# Patient Record
Sex: Female | Born: 1990 | Race: Black or African American | Hispanic: No | Marital: Married | Smoking: Never smoker
Health system: Southern US, Community
[De-identification: ages and names within clinical notes are randomized; demographics above are authoritative.]

## PROBLEM LIST (undated history)

## (undated) DIAGNOSIS — J353 Hypertrophy of tonsils with hypertrophy of adenoids: Secondary | ICD-10-CM

## (undated) DIAGNOSIS — Z98811 Dental restoration status: Secondary | ICD-10-CM

## (undated) HISTORY — PX: WISDOM TOOTH EXTRACTION: SHX21

## (undated) HISTORY — PX: HAND TENDON SURGERY: SHX663

## (undated) HISTORY — PX: FRACTURE SURGERY: SHX138

---
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
## 2012-04-20 ENCOUNTER — Encounter: Payer: Self-pay | Admitting: Obstetrics and Gynecology

## 2012-04-20 ENCOUNTER — Ambulatory Visit (INDEPENDENT_AMBULATORY_CARE_PROVIDER_SITE_OTHER): Payer: BC Managed Care – HMO | Admitting: Obstetrics and Gynecology

## 2012-04-20 VITALS — BP 108/60 | HR 92 | Temp 97.9°F | Resp 20 | Ht 65.5 in | Wt 120.0 lb

## 2012-04-20 DIAGNOSIS — N946 Dysmenorrhea, unspecified: Secondary | ICD-10-CM | POA: Insufficient documentation

## 2012-04-20 DIAGNOSIS — N920 Excessive and frequent menstruation with regular cycle: Secondary | ICD-10-CM

## 2012-04-20 HISTORY — DX: Dysmenorrhea, unspecified: N94.6

## 2012-04-20 HISTORY — DX: Excessive and frequent menstruation with regular cycle: N92.0

## 2012-04-20 LAB — CBC
Hemoglobin: 12.4 g/dL (ref 12.0–15.0)
MCHC: 32.2 g/dL (ref 30.0–36.0)
RDW: 13.9 % (ref 11.5–15.5)
WBC: 7.9 10*3/uL (ref 4.0–10.5)

## 2012-04-20 MED ORDER — LEVONORGESTREL-ETHINYL ESTRAD 0.1-20 MG-MCG PO TABS: 1.0000 | ORAL_TABLET | Freq: Every day | ORAL | Status: DC

## 2012-04-20 NOTE — Patient Instructions (Signed)

## 2012-04-20 NOTE — Progress Notes (Signed)
New Patient visit Subjective:    Kristina Galvan is a 21 y.o. female, G0P0000, who presents for an annual exam. She is here for evaluation of menorrhagia and severe dysmenorrhea which has been present for the last 3 years.in addition to menstrual cramps which begin with the onset of bleeding. She has nausea dizziness fatigue tingling in her head and arms and legs as well as severe back pain. She had significant clots and sometimes bleeds through her protection to her clothing. She has no intermenstrual bleeding.  History   Social History  . Marital Status: Married    Spouse Name: N/A    Number of Children: N/A  . Years of Education: N/A   Social History Main Topics  . Smoking status: Never Smoker   . Smokeless tobacco: Never Used  . Alcohol Use: No  . Drug Use: No  . Sexually Active: No   Other Topics Concern  . None   Social History Narrative  . None    Menstrual cycle:   LMP: Patient's last menstrual period was 04/01/2012.           Cycle: monthly lasting 7 days with the first 2-3 days being very very heavy  The following portions of the patient's history were reviewed and updated as appropriate: allergies, current medications, past family history, past medical history, past social history, past surgical history and problem list.  Review of Systems Pertinent items are noted in HPI. Breast:Negative for breast lump,nipple discharge or nipple retraction Gastrointestinal: Negative for abdominal pain, change in bowel habits or rectal bleeding Urinary:negative   Objective:    BP 108/60  Pulse 92  Temp 97.9 F (36.6 C)  Resp 20  Ht 5' 5.5" (1.664 m)  Wt 120 lb (54.432 kg)  BMI 19.67 kg/m2  LMP 04/01/2012    Weight:  Wt Readings from Last 1 Encounters:  04/20/12 120 lb (54.432 kg) (39.76%*)   * Growth percentiles are based on CDC 2-20 Years data.          BMI: Body mass index is 19.67 kg/(m^2).  General Appearance: Alert, appropriate appearance for age. No acute  distress HEENT: Grossly normal Neck / Thyroid: Supple, no masses, nodes or enlargement Lungs: clear to auscultation bilaterally Back: No CVA tenderness Breast Exam: No masses or nodes.No dimpling, nipple retraction or discharge. Cardiovascular: Regular rate and rhythm. S1, S2, no murmur Gastrointestinal: Soft, non-tender, no masses or organomegaly Pelvic Exam: Vulva and vagina appear normal. Bimanual exam reveals normal uterus and adnexa. Rectovaginal: normal rectal, no masses Lymphatic Exam: Non-palpable nodes in neck, clavicular, axillary, or inguinal regions Skin: no rash or abnormalities Neurologic: Normal gait and speech, no tremor  Psychiatric: Alert and oriented, appropriate affect.   Wet Prep:not applicable Urinalysis:not applicable UPT: Not done   Assessment:    Normal gyn exam severe dysmenorrhea and menorrhagia    Plan:    Options for management including periodic nonsteroidal anti-inflammatory drugs and oral contraceptives were discussed with the patient and her mother. She accepts oral contraceptive pills. Alesse one p.o. Q.d. To begin the first day of her menses is prescribed. She is given written instructions on use of her oral contraceptive pills from [facility] and printed instructions and information about oral contraception STD screening: declined.  Patient is not sexually active Contraception:oral contraceptives (estrogen/progesterone) followup in 2-3 months to assess the need for extended cycle birth control pills to control the patient's menorrhagia and dysmenorrhea CBC today      Danine Hor PMD  C/o  dysmenorrhea and menorrhagia. Cycle usually last approx 7 days with heavy bldg for the first 2-3days. C/o severe nausea;occ vomiting with menses. No diarrhea. Pt has never used birth control for menses regulation.

## 2012-04-30 ENCOUNTER — Telehealth: Payer: Self-pay | Admitting: Obstetrics and Gynecology

## 2012-04-30 NOTE — Telephone Encounter (Signed)
Lm with female for pt to call back per telephone call.

## 2012-04-30 NOTE — Telephone Encounter (Signed)
Chandra/called pt.

## 2012-04-30 NOTE — Telephone Encounter (Signed)
vph pt 

## 2012-05-04 NOTE — Telephone Encounter (Signed)
Pc to pt per telephone call. Pt not available.  Chart to fb.

## 2012-05-07 ENCOUNTER — Telehealth: Payer: Self-pay | Admitting: Obstetrics and Gynecology

## 2012-05-07 NOTE — Telephone Encounter (Signed)
Tc to pt per test results. Told pt CBC-wnl per vph. Pt voices understanding.

## 2012-05-07 NOTE — Telephone Encounter (Signed)
chandra/res °

## 2012-05-10 ENCOUNTER — Telehealth: Payer: Self-pay | Admitting: Obstetrics and Gynecology

## 2012-05-10 NOTE — Telephone Encounter (Signed)
Tc to pt per telephone call. Spoke with pt's mother. Pt c/o occ headaches and some occ leg/arm cramping after starting Alesse. Informed pt's mother to have pt increase water intake (64oz daily) to see if helps with headaches and cramping. If no improvement, pt to call the office for eval.  Pt's mother voices understanding.

## 2012-05-10 NOTE — Telephone Encounter (Signed)
Chandra/rx [facility].

## 2012-06-14 ENCOUNTER — Encounter: Payer: Self-pay | Admitting: Obstetrics and Gynecology

## 2012-06-14 ENCOUNTER — Ambulatory Visit (INDEPENDENT_AMBULATORY_CARE_PROVIDER_SITE_OTHER): Payer: BC Managed Care – HMO | Admitting: Obstetrics and Gynecology

## 2012-06-14 VITALS — BP 104/62 | Ht 65.5 in | Wt 123.0 lb

## 2012-06-14 DIAGNOSIS — N920 Excessive and frequent menstruation with regular cycle: Secondary | ICD-10-CM

## 2012-06-14 DIAGNOSIS — N946 Dysmenorrhea, unspecified: Secondary | ICD-10-CM

## 2012-06-14 NOTE — Progress Notes (Signed)
GYN PROBLEM VISIT  [name] is a 21 y.o. year old female,G0P0000, who presents for a problem visit. She was started on oral contraceptive pills at her last visit for management of dysmenorrhea and menorrhagia.  Subjective:she has completed a single cycle of birth control pills. She had breakthrough bleeding on the third week of active pills for 4 days and then no further bleeding. She started a new package of birth control pills on yesterday.  She had cramping for which she took ibuprofen for 2 days.   Objective:  BP 104/62  Ht 5' 5.5" (1.664 m)  Wt 123 lb (55.792 kg)  BMI 20.16 kg/m2  LMP 05/17/2012    Assessment: Menorrhagia and primary dysmenorrhea  Plan: Begin extended cycle birth control pills, taking active birth control pills for 12 weeks followed by 3 days of in active pills then starting the 12 weeks cycle once again Take ibuprofen 600 mg every 6 hours for 2 days starting the last day of active pills in the 12 weeks cycle The patient and her mother were present for the instructions and acknowledged understanding. Written instructions were given. Return to office in 4 month(s).December 2013   Dierdre Forth,   06/14/2012 4:34 PM

## 2012-06-14 NOTE — Patient Instructions (Signed)
Take active BCPs daily for 12 consecutive weeks, then take 3 inactive pills that will allow you to have your period. Take Ibuprofen 600mg  every 6 hours beginning the last day of active pills and continue for 2 days.

## 2012-06-15 ENCOUNTER — Other Ambulatory Visit: Payer: Self-pay

## 2012-06-15 MED ORDER — LEVONORGESTREL-ETHINYL ESTRAD 0.1-20 MG-MCG PO TABS: ORAL_TABLET | ORAL | Status: DC

## 2012-06-15 MED ORDER — IBUPROFEN 600 MG PO TABS: ORAL_TABLET | ORAL | Status: DC

## 2012-08-04 ENCOUNTER — Telehealth: Payer: Self-pay | Admitting: Obstetrics and Gynecology

## 2012-08-04 NOTE — Telephone Encounter (Signed)
vph pt 

## 2012-08-04 NOTE — Telephone Encounter (Signed)
Tc to pt per telephone call. Pt states,"taking Ibuprofen 600mg  every 6 hours as directed for painful periods by Dr.Haygood given 06/14/12". Pt c/o no improvement of pain during day with taking Ibuprofen;however has mild improvement at night during cycle.  No fever. LMP 07-29-12 with increased pain(7/10) and heaviness of menses. Pt denies missing any pills. Pt did not have a cycle for month of Aug(first occurrence).  Sx's have resolved at this time due to not being on menses. Pt wants [facility] to increase dose of Ibuprofen to take during cycle for future. Informed pt to first take a UPT and cb in the am with results. Will consult with vph per recs after UPT results. Pt voices understanding.

## 2012-08-04 NOTE — Telephone Encounter (Signed)
Need to know whether pt is taking BCPs exactly as prescribed in extended cycle. Ibuprofen can be taken 800mg  po every 8 hrs for the first 24 hrs of cramping, then only as needed for pain.  #30 with 6 refills can be sent to pharmacy.

## 2012-08-06 ENCOUNTER — Other Ambulatory Visit: Payer: Self-pay

## 2012-08-06 ENCOUNTER — Telehealth: Payer: Self-pay | Admitting: Obstetrics and Gynecology

## 2012-08-06 MED ORDER — IBUPROFEN 800 MG PO TABS: ORAL_TABLET | ORAL | Status: DC

## 2012-08-06 NOTE — Telephone Encounter (Signed)
Tc to pt per [facility] recs. Pt agrees to taking BCP's as directed. UPT=neg on 08/04/12 per pt. Rx for Ibuprofen 800mg  on file e-pres to pharm on file. Pt voices understanding.

## 2012-08-20 ENCOUNTER — Telehealth: Payer: Self-pay | Admitting: Obstetrics and Gynecology

## 2012-08-20 NOTE — Telephone Encounter (Signed)
Spoke with pt rgd msg pt states been on cycle for two week spt states missed pills last week informed pt when pills are missed can cause irreg bleeding or pro long bleeding it bleeding continues call office pt voice understanding

## 2012-09-16 ENCOUNTER — Telehealth: Payer: Self-pay | Admitting: Obstetrics and Gynecology

## 2012-09-16 DIAGNOSIS — IMO0001 Reserved for inherently not codable concepts without codable children: Secondary | ICD-10-CM

## 2012-09-16 MED ORDER — LEVONORGESTREL-ETHINYL ESTRAD 0.1-20 MG-MCG PO TABS: ORAL_TABLET | ORAL | Status: DC

## 2012-09-16 NOTE — Telephone Encounter (Signed)
Lm on pt's moms cell phone rgs switching pharmacy's approved rx and e-scriped over to the pharmacy . Pt aware of rx .

## 2012-11-16 ENCOUNTER — Telehealth: Payer: Self-pay | Admitting: Obstetrics and Gynecology

## 2012-11-16 NOTE — Telephone Encounter (Signed)
Tc to pt per telephone call.  Appt sched 11/18/12 @ 3:30p with [facility] to discuss another bc option. Pt voices understanding.

## 2012-11-18 ENCOUNTER — Ambulatory Visit (INDEPENDENT_AMBULATORY_CARE_PROVIDER_SITE_OTHER): Payer: BC Managed Care – PPO | Admitting: Obstetrics and Gynecology

## 2012-11-18 ENCOUNTER — Encounter: Payer: Self-pay | Admitting: Obstetrics and Gynecology

## 2012-11-18 VITALS — BP 104/62 | HR 68 | Wt 131.0 lb

## 2012-11-18 DIAGNOSIS — N83209 Unspecified ovarian cyst, unspecified side: Secondary | ICD-10-CM

## 2012-11-18 DIAGNOSIS — N926 Irregular menstruation, unspecified: Secondary | ICD-10-CM

## 2012-11-18 DIAGNOSIS — Z309 Encounter for contraceptive management, unspecified: Secondary | ICD-10-CM

## 2012-11-18 DIAGNOSIS — IMO0001 Reserved for inherently not codable concepts without codable children: Secondary | ICD-10-CM

## 2012-11-18 DIAGNOSIS — R5381 Other malaise: Secondary | ICD-10-CM

## 2012-11-18 DIAGNOSIS — R5383 Other fatigue: Secondary | ICD-10-CM

## 2012-11-18 DIAGNOSIS — N946 Dysmenorrhea, unspecified: Secondary | ICD-10-CM

## 2012-11-18 LAB — CBC
HCT: 33.8 % — ABNORMAL LOW (ref 36.0–46.0)
Platelets: 288 10*3/uL (ref 150–400)
RBC: 3.73 MIL/uL — ABNORMAL LOW (ref 3.87–5.11)
RDW: 13.6 % (ref 11.5–15.5)
WBC: 6.1 10*3/uL (ref 4.0–10.5)

## 2012-11-18 LAB — POCT URINE PREGNANCY: Preg Test, Ur: NEGATIVE

## 2012-11-18 MED ORDER — MEDROXYPROGESTERONE ACETATE 10 MG PO TABS: 10.0000 mg | ORAL_TABLET | Freq: Every day | ORAL | Status: DC

## 2012-11-18 MED ORDER — HYDROCODONE-ACETAMINOPHEN 5-325MG PREPACK (~~LOC~~: ORAL_TABLET | ORAL | Status: DC

## 2012-11-18 MED ORDER — NORGESTIMATE-ETH ESTRADIOL 0.25-35 MG-MCG PO TABS: 1.0000 | ORAL_TABLET | Freq: Every day | ORAL | Status: DC

## 2012-11-18 NOTE — Patient Instructions (Signed)
Take the Provera (medroxyprogesterone acetate) as directed and once that is finished begin the Sprintec (Ortho Cyclen) 1 tablet daily

## 2012-11-18 NOTE — Progress Notes (Signed)
  Current contraception: OCP (estrogen/progesterone).  ORSYTHIA 28 Hormone replacement therapy: No New medication: No  History of NWG:NFAO  History of infertility: no. History of abnormal Pap smear: no History of fibroids: No  Increased stress: No  Abnormal bleeding pattern started: DEC 10 AND IS STILL GOING. SINCE PT STARTED B/C; SHE FEEL TIRED ALL THE TIME AND HER CYCLES ARE HEAVIER. PT WANT TO DISCUSS OTHER OPTIONS.

## 2012-11-18 NOTE — Progress Notes (Signed)
21 YO on Orsythia 1/20 complains of bleeding since the second week in December with pad change twice a day and cramps rated 5/10 on a 10 point pain scale.  Pain is worse on the right.  Has had a cyst on right ovary in the past 5 cm but never had follow up.  Prior to December would bleed 7 days and change pad 3 times a day with 5/10 cramps-all on BCPs.  Has been on these pills since last spring.  Admits to feels tired all the time, craves ice but doesn't eat it, no urinary tract symptoms or change in bowel movement.  Cramps have not been relieved with Ibuprofen 800 mg  O: Abdomen: soft, non-tender       Pelvic: EGBUS- wnl/blood stained;  unable to examine due to patient anxiety  UPT-negative  A: Unscheduled Bleeding on BCPs     H/O Right Ovarian Cyst  (5 cm)     Dysmenorrhea  P:  CBC, TSH, Pelvic U/S to evaluate ovarian cyst (FULL BLADDER)       Provera 10 mg  #30  1 po q 6 hours until bleeding stops then daily x 14 days then      begin ortho cyclen #1 1 po qd  5 refills       RTO-as scheduled  Kyia Rhude, PA-C

## 2012-11-19 ENCOUNTER — Telehealth: Payer: Self-pay | Admitting: Obstetrics and Gynecology

## 2012-11-19 NOTE — Telephone Encounter (Signed)
Call to patient and left message that her lab results (CBC & TSH) were wnl.  Patient encouraged to proceed with outlined plan and to call if her symptoms do not improve.  Olivya Sobol, PA-C

## 2013-01-24 ENCOUNTER — Encounter: Payer: BC Managed Care – PPO | Admitting: Obstetrics and Gynecology

## 2013-01-24 ENCOUNTER — Other Ambulatory Visit: Payer: BC Managed Care – PPO

## 2013-01-24 ENCOUNTER — Encounter: Payer: Self-pay | Admitting: Obstetrics and Gynecology

## 2013-01-24 ENCOUNTER — Ambulatory Visit: Payer: BC Managed Care – PPO | Admitting: Obstetrics and Gynecology

## 2013-01-24 ENCOUNTER — Ambulatory Visit: Payer: BC Managed Care – PPO

## 2013-01-24 VITALS — BP 118/64 | Ht 65.0 in | Wt 131.0 lb

## 2013-01-24 DIAGNOSIS — N83209 Unspecified ovarian cyst, unspecified side: Secondary | ICD-10-CM

## 2013-01-24 DIAGNOSIS — N946 Dysmenorrhea, unspecified: Secondary | ICD-10-CM

## 2013-01-24 DIAGNOSIS — N926 Irregular menstruation, unspecified: Secondary | ICD-10-CM

## 2013-01-24 DIAGNOSIS — N83201 Unspecified ovarian cyst, right side: Secondary | ICD-10-CM

## 2013-01-24 NOTE — Progress Notes (Signed)
21 YO seen in [place] with a 5 cm right ovarian cyst returns for follow up. Patient reports mid-back pain & headache as well as breast tenderness.  Denies nipple  discharge, axillary nodules or chest trauma. Admits to large caffiene & sodium intake. States that back pain is achy and has been present x 1 week.  Denies any injury, exercise or change in mattress but states pain is worse with sitting up straight and lying down.  Headache is frontal without nausea or blurred vision. Both back pain and headache is relieved with Marlin Canary Powder.  O: U/S: uterus-7.76 x 4.73 x 3.64 cm; normal appearing ovaries-cyst resolved  Breast: symmetric; no palpable nodules, skin changes, nipple discharge, axillary adenopathy or retractions in either breast  A: Resolved Right Ovarian Cyst     Mastalgia     Back Pain     Headache  P: Gradually decrease caffiene intake       Follow up with Dr. Tiburcio Pea for back pain & headache        RTO--as scheduled or prn  Pam Vanalstine, PA-C

## 2015-10-23 ENCOUNTER — Other Ambulatory Visit: Payer: Self-pay | Admitting: Otolaryngology

## 2016-01-09 DIAGNOSIS — J353 Hypertrophy of tonsils with hypertrophy of adenoids: Secondary | ICD-10-CM

## 2016-01-09 HISTORY — DX: Hypertrophy of tonsils with hypertrophy of adenoids: J35.3

## 2016-01-14 ENCOUNTER — Encounter (HOSPITAL_BASED_OUTPATIENT_CLINIC_OR_DEPARTMENT_OTHER): Payer: Self-pay | Admitting: *Deleted

## 2016-01-21 ENCOUNTER — Ambulatory Visit (HOSPITAL_BASED_OUTPATIENT_CLINIC_OR_DEPARTMENT_OTHER)
Admission: RE | Admit: 2016-01-21 | Discharge: 2016-01-21 | Disposition: A | Discharge status: Home / Self Care | Payer: 59 | Source: Ambulatory Visit | Attending: Otolaryngology | Admitting: Otolaryngology

## 2016-01-21 ENCOUNTER — Encounter (HOSPITAL_BASED_OUTPATIENT_CLINIC_OR_DEPARTMENT_OTHER)
Admission: RE | Disposition: A | Discharge status: Home / Self Care | Payer: Self-pay | Source: Ambulatory Visit | Attending: Otolaryngology

## 2016-01-21 ENCOUNTER — Encounter (HOSPITAL_BASED_OUTPATIENT_CLINIC_OR_DEPARTMENT_OTHER): Payer: Self-pay | Admitting: *Deleted

## 2016-01-21 ENCOUNTER — Ambulatory Visit (HOSPITAL_BASED_OUTPATIENT_CLINIC_OR_DEPARTMENT_OTHER): Payer: 59 | Admitting: Anesthesiology

## 2016-01-21 DIAGNOSIS — J029 Acute pharyngitis, unspecified: Secondary | ICD-10-CM | POA: Insufficient documentation

## 2016-01-21 DIAGNOSIS — J3501 Chronic tonsillitis: Secondary | ICD-10-CM | POA: Diagnosis not present

## 2016-01-21 DIAGNOSIS — J353 Hypertrophy of tonsils with hypertrophy of adenoids: Secondary | ICD-10-CM | POA: Insufficient documentation

## 2016-01-21 HISTORY — DX: Hypertrophy of tonsils with hypertrophy of adenoids: J35.3

## 2016-01-21 HISTORY — DX: Dental restoration status: Z98.811

## 2016-01-21 HISTORY — PX: TONSILLECTOMY AND ADENOIDECTOMY: SHX28

## 2016-01-21 LAB — POCT HEMOGLOBIN-HEMACUE: HEMOGLOBIN: 13.3 g/dL (ref 12.0–15.0)

## 2016-01-21 SURGERY — TONSILLECTOMY AND ADENOIDECTOMY
Anesthesia: General | Site: Throat | Laterality: Bilateral

## 2016-01-21 MED ORDER — BACITRACIN ZINC 500 UNIT/GM EX OINT
TOPICAL_OINTMENT | CUTANEOUS | Status: AC
  Filled 2016-01-21: qty 0.9

## 2016-01-21 MED ORDER — DEXAMETHASONE SODIUM PHOSPHATE 4 MG/ML IJ SOLN
INTRAMUSCULAR | Status: DC | PRN
  Administered 2016-01-21: 10 mg via INTRAVENOUS

## 2016-01-21 MED ORDER — PROPOFOL 10 MG/ML IV BOLUS
INTRAVENOUS | Status: DC | PRN
  Administered 2016-01-21: 200 mg via INTRAVENOUS

## 2016-01-21 MED ORDER — SCOPOLAMINE 1 MG/3DAYS TD PT72: 1.0000 | MEDICATED_PATCH | Freq: Once | TRANSDERMAL | Status: DC | PRN

## 2016-01-21 MED ORDER — LIDOCAINE HCL (CARDIAC) 20 MG/ML IV SOLN
INTRAVENOUS | Status: DC | PRN
  Administered 2016-01-21: 50 mg via INTRAVENOUS

## 2016-01-21 MED ORDER — MEPERIDINE HCL 25 MG/ML IJ SOLN: 6.2500 mg | INTRAMUSCULAR | Status: DC | PRN

## 2016-01-21 MED ORDER — SODIUM CHLORIDE 0.9 % IR SOLN
Status: DC | PRN
  Administered 2016-01-21: 500 mL

## 2016-01-21 MED ORDER — DEXAMETHASONE SODIUM PHOSPHATE 10 MG/ML IJ SOLN
INTRAMUSCULAR | Status: AC
  Filled 2016-01-21: qty 1

## 2016-01-21 MED ORDER — LACTATED RINGERS IV SOLN
INTRAVENOUS | Status: DC
  Administered 2016-01-21: 10 mL/h via INTRAVENOUS
  Administered 2016-01-21: 09:00:00 via INTRAVENOUS

## 2016-01-21 MED ORDER — MORPHINE SULFATE (PF) 10 MG/ML IV SOLN
INTRAVENOUS | Status: AC
  Filled 2016-01-21: qty 1

## 2016-01-21 MED ORDER — OXYCODONE HCL 5 MG/5ML PO SOLN: 5.0000 mg | ORAL | Status: DC | PRN

## 2016-01-21 MED ORDER — OXYCODONE HCL 5 MG/5ML PO SOLN: 5.0000 mg | Freq: Once | ORAL | Status: DC | PRN

## 2016-01-21 MED ORDER — ONDANSETRON HCL 4 MG/2ML IJ SOLN
INTRAMUSCULAR | Status: DC | PRN
  Administered 2016-01-21: 4 mg via INTRAVENOUS

## 2016-01-21 MED ORDER — GLYCOPYRROLATE 0.2 MG/ML IJ SOLN: 0.2000 mg | Freq: Once | INTRAMUSCULAR | Status: DC | PRN

## 2016-01-21 MED ORDER — LIDOCAINE HCL (CARDIAC) 20 MG/ML IV SOLN
INTRAVENOUS | Status: AC
  Filled 2016-01-21: qty 5

## 2016-01-21 MED ORDER — ONDANSETRON HCL 4 MG/2ML IJ SOLN
INTRAMUSCULAR | Status: AC
  Filled 2016-01-21: qty 2

## 2016-01-21 MED ORDER — SUCCINYLCHOLINE CHLORIDE 20 MG/ML IJ SOLN
INTRAMUSCULAR | Status: DC | PRN
  Administered 2016-01-21: 100 mg via INTRAVENOUS

## 2016-01-21 MED ORDER — FENTANYL CITRATE (PF) 100 MCG/2ML IJ SOLN
50.0000 ug | INTRAMUSCULAR | Status: DC | PRN
  Administered 2016-01-21: 100 ug via INTRAVENOUS

## 2016-01-21 MED ORDER — HYDROMORPHONE HCL 1 MG/ML IJ SOLN
0.2500 mg | INTRAMUSCULAR | Status: DC | PRN
  Administered 2016-01-21 (×2): 0.5 mg via INTRAVENOUS

## 2016-01-21 MED ORDER — OXYMETAZOLINE HCL 0.05 % NA SOLN
NASAL | Status: AC
  Filled 2016-01-21: qty 15

## 2016-01-21 MED ORDER — AMOXICILLIN 400 MG/5ML PO SUSR
800.0000 mg | Freq: Two times a day (BID) | ORAL | Status: AC
Start: 2016-01-21 — End: 2016-01-25

## 2016-01-21 MED ORDER — HYDROMORPHONE HCL 1 MG/ML IJ SOLN
INTRAMUSCULAR | Status: AC
  Filled 2016-01-21: qty 1

## 2016-01-21 MED ORDER — MIDAZOLAM HCL 2 MG/2ML IJ SOLN
INTRAMUSCULAR | Status: AC
  Filled 2016-01-21: qty 2

## 2016-01-21 MED ORDER — OXYCODONE HCL 5 MG PO TABS: 5.0000 mg | ORAL_TABLET | Freq: Once | ORAL | Status: DC | PRN

## 2016-01-21 MED ORDER — FENTANYL CITRATE (PF) 100 MCG/2ML IJ SOLN
INTRAMUSCULAR | Status: AC
  Filled 2016-01-21: qty 2

## 2016-01-21 MED ORDER — OXYMETAZOLINE HCL 0.05 % NA SOLN
NASAL | Status: DC | PRN
  Administered 2016-01-21: 1 via TOPICAL

## 2016-01-21 MED ORDER — MIDAZOLAM HCL 2 MG/2ML IJ SOLN
1.0000 mg | INTRAMUSCULAR | Status: DC | PRN
  Administered 2016-01-21: 2 mg via INTRAVENOUS

## 2016-01-21 MED ORDER — MORPHINE SULFATE 10 MG/ML IJ SOLN
INTRAMUSCULAR | Status: DC | PRN
  Administered 2016-01-21: 4 mg via INTRAVENOUS

## 2016-01-21 SURGICAL SUPPLY — 31 items
BANDAGE COBAN STERILE 2 (GAUZE/BANDAGES/DRESSINGS) IMPLANT
CANISTER SUCT 1200ML W/VALVE (MISCELLANEOUS) ×3 IMPLANT
CATH ROBINSON RED A/P 10FR (CATHETERS) IMPLANT
CATH ROBINSON RED A/P 14FR (CATHETERS) ×2 IMPLANT
COAGULATOR SUCT 6 FR SWTCH (ELECTROSURGICAL)
COAGULATOR SUCT SWTCH 10FR 6 (ELECTROSURGICAL) IMPLANT
COVER MAYO STAND STRL (DRAPES) ×3 IMPLANT
ELECT REM PT RETURN 9FT ADLT (ELECTROSURGICAL) ×3
ELECT REM PT RETURN 9FT PED (ELECTROSURGICAL)
ELECTRODE REM PT RETRN 9FT PED (ELECTROSURGICAL) IMPLANT
ELECTRODE REM PT RTRN 9FT ADLT (ELECTROSURGICAL) IMPLANT
GLOVE BIO SURGEON STRL SZ7.5 (GLOVE) ×3 IMPLANT
GLOVE SURG SS PI 7.0 STRL IVOR (GLOVE) ×2 IMPLANT
GOWN STRL REUS W/ TWL LRG LVL3 (GOWN DISPOSABLE) ×2 IMPLANT
GOWN STRL REUS W/TWL LRG LVL3 (GOWN DISPOSABLE) ×6
IV NS 500ML (IV SOLUTION) ×3
IV NS 500ML BAXH (IV SOLUTION) ×1 IMPLANT
MARKER SKIN DUAL TIP RULER LAB (MISCELLANEOUS) IMPLANT
NS IRRIG 1000ML POUR BTL (IV SOLUTION) ×3 IMPLANT
SHEET MEDIUM DRAPE 40X70 STRL (DRAPES) ×3 IMPLANT
SOLUTION BUTLER CLEAR DIP (MISCELLANEOUS) ×3 IMPLANT
SPONGE GAUZE 4X4 12PLY STER LF (GAUZE/BANDAGES/DRESSINGS) ×3 IMPLANT
SPONGE TONSIL 1 RF SGL (DISPOSABLE) IMPLANT
SPONGE TONSIL 1.25 RF SGL STRG (GAUZE/BANDAGES/DRESSINGS) ×2 IMPLANT
SYR BULB 3OZ (MISCELLANEOUS) IMPLANT
TOWEL OR 17X24 6PK STRL BLUE (TOWEL DISPOSABLE) ×3 IMPLANT
TUBE CONNECTING 20'X1/4 (TUBING) ×1
TUBE CONNECTING 20X1/4 (TUBING) ×2 IMPLANT
TUBE SALEM SUMP 12R W/ARV (TUBING) IMPLANT
TUBE SALEM SUMP 16 FR W/ARV (TUBING) ×2 IMPLANT
WAND COBLATOR 70 EVAC XTRA (SURGICAL WAND) ×3 IMPLANT

## 2016-01-21 NOTE — Anesthesia Postprocedure Evaluation (Signed)
Anesthesia Post Note  Patient: Kristina LeaderCiara D Johnson  Procedure(s) Performed: Procedure(s) (LRB): TONSILLECTOMY AND ADENOIDECTOMY (Bilateral)  Patient location during evaluation: PACU Anesthesia Type: General Level of consciousness: awake and alert Pain management: pain level controlled Vital Signs Assessment: post-procedure vital signs reviewed and stable Respiratory status: spontaneous breathing, nonlabored ventilation and respiratory function stable Cardiovascular status: blood pressure returned to baseline and stable Postop Assessment: no signs of nausea or vomiting Anesthetic complications: no    Last Vitals:  Filed Vitals:   01/21/16 1030 01/21/16 1045  BP: 125/86 119/91  Pulse: 87 93  Temp:    Resp: 13 13    Last Pain:  Filed Vitals:   01/21/16 1046  PainSc: 5                  Partick Musselman A

## 2016-01-21 NOTE — H&P (Signed)
Cc: Recurrent Tonsillitis  HPI: The patient is a 25 y/o female who presents today with her mother. According to the patient, she has been experiencing recurrent sore throat for the past 7 months. She has been treated with multiple oral antibiotics. The patient states she had a lot of strep throat when in middle school. The patient is also noted to snore loudly. The mother is unsure of any witnessed apnea. The patient also notes some chronic nasal congestion. She previously underwent bilateral myringotomy with tubes as a child.   The patient's review of systems (constitutional, eyes, ENT, cardiovascular, respiratory, GI, musculoskeletal, skin, neurologic, psychiatric, endocrine, hematologic, allergic) is noted in the ROS questionnaire.  It is reviewed with the patient.   Family health history: Diabetes, Heart disease.   Major events: Left wrist tendon surgery.   Ongoing medical problems: None.   Social history: The patient is single. She denies the use of alcohol, tobacco or illegal drugs.  Exam General: Communicates without difficulty, well nourished, no acute distress. Head:  Normocephalic, no lesions or asymmetry. Eyes: PERRL, EOMI. No scleral icterus, conjunctivae clear.  Neuro: CN II exam reveals vision grossly intact.  No nystagmus at any point of gaze. Auricles: Intact without lesions.  EAC: Bilateral cerumen impaction.  Under the operating microscope, the cerumen is carefully removed with a combination of cerumen currette, alligator forceps, and suction catheters.  After the cerumen is removed, the TMs are noted to be normal.  No mass, erythema, or lesions. Nose: Moist, pink mucosa without lesions or mass. Mouth: Oral cavity clear and moist, no lesions, tonsils symmetric. Tonsils are 3+. Tonsils with mild erythema. Neck: Full range of motion, no lymphadenopathy or masses.   Assessment 1.  The patient's history and physical exam findings are consistent with recurrent tonsillitis/pharyngitis  secondary to adenotonsillar hypertrophy. 2.  Incidental finding of bilateral cerumen impaction.  Plan 1. Otomicroscopy with bilateral cerumen removal. 2. The treatment options for the adenotonsilar hypertrophy  include continuing conservative observation versus adenotonsillectomy.  The risks, benefits, alternatives, and details of the procedure are reviewed with the patient and the parent.  Questions are invited and answered.  3. The patient would like to proceed with the T&A surgery.

## 2016-01-21 NOTE — Op Note (Signed)
DATE OF PROCEDURE:  01/21/2016                              OPERATIVE REPORT  SURGEON:  Newman PiesSu Guerry Covington, MD  PREOPERATIVE DIAGNOSES: 1. Adenotonsillar hypertrophy. 2. Chronic tonsillitis and pharyngitis  POSTOPERATIVE DIAGNOSES: 1. Adenotonsillar hypertrophy. 2. Chronic tonsillitis and pharyngitis  PROCEDURE PERFORMED:  Adenotonsillectomy.  ANESTHESIA:  General endotracheal tube anesthesia.  COMPLICATIONS:  None.  ESTIMATED BLOOD LOSS:  Minimal.  INDICATION FOR PROCEDURE:  Kristina Galvan is a 25 y.o. female with a history of chronic tonsillitis/pharyngitis.  According to the patient, she has been experiencing chronic throat discomfort with halitosis for several years. The patient continues to be symptomatic despite medical treatments. On examination, the patient was noted to have bilateral cryptic tonsils, with numerous tonsilloliths. Based on the above findings, the decision was made for the patient to undergo the adenotonsillectomy procedure. Likelihood of success in reducing symptoms was also discussed.  The risks, benefits, alternatives, and details of the procedure were discussed with the mother.  Questions were invited and answered.  Informed consent was obtained.  DESCRIPTION:  The patient was taken to the operating room and placed supine on the operating table.  General endotracheal tube anesthesia was administered by the anesthesiologist.  The patient was positioned and prepped and draped in a standard fashion for adenotonsillectomy.  A Crowe-Davis mouth gag was inserted into the oral cavity for exposure. 3+ cryptic tonsils were noted bilaterally.  No bifidity was noted.  Indirect mirror examination of the nasopharynx revealed mild adenoid hypertrophy. The adenoid was ablated with the Coblator device. Hemostasis was achieved with the Coblator device.  The right tonsil was then grasped with a straight Allis clamp and retracted medially.  It was resected free from the underlying pharyngeal  constrictor muscles with the Coblator device.  The same procedure was repeated on the left side without exception.  The surgical sites were copiously irrigated.  The mouth gag was removed.  The care of the patient was turned over to the anesthesiologist.  The patient was awakened from anesthesia without difficulty.  The patient was extubated and transferred to the recovery room in good condition.  OPERATIVE FINDINGS:  Adenotonsillar hypertrophy.  SPECIMEN:  None  FOLLOWUP CARE:  The patient will be discharged home once awake and alert.  She will be placed on amoxicillin 800 mg p.o. b.i.d. for 5 days, and oxycodone 5-6410ml po q 4 hours for postop pain control.   The patient will follow up in my office in approximately 2 weeks.  Darletta MollEOH,SUI W 01/21/2016 9:32 AM

## 2016-01-21 NOTE — Transfer of Care (Signed)
Immediate Anesthesia Transfer of Care Note  Patient: Kristina Galvan  Procedure(s) Performed: Procedure(s): TONSILLECTOMY AND ADENOIDECTOMY (Bilateral)  Patient Location: PACU  Anesthesia Type:General  Level of Consciousness: sedated  Airway & Oxygen Therapy: Patient Spontanous Breathing and Patient connected to face mask oxygen  Post-op Assessment: Report given to RN and Post -op Vital signs reviewed and stable  Post vital signs: Reviewed and stable  Last Vitals:  Filed Vitals:   01/21/16 0828  BP: 122/79  Pulse: 91  Temp: 36.7 C  Resp: 16    Complications: No apparent anesthesia complications

## 2016-01-21 NOTE — Anesthesia Procedure Notes (Signed)
Procedure Name: Intubation Date/Time: 01/21/2016 9:06 AM Performed by: Caren MacadamARTER, Arabelle Bollig W Pre-anesthesia Checklist: Patient identified, Emergency Drugs available, Suction available and Patient being monitored Patient Re-evaluated:Patient Re-evaluated prior to inductionOxygen Delivery Method: Circle System Utilized Preoxygenation: Pre-oxygenation with 100% oxygen Intubation Type: IV induction Ventilation: Mask ventilation without difficulty Laryngoscope Size: Miller and 2 Grade View: Grade I Tube type: Oral Number of attempts: 1 Airway Equipment and Method: Stylet and Oral airway Placement Confirmation: ETT inserted through vocal cords under direct vision,  positive ETCO2 and breath sounds checked- equal and bilateral Secured at: 22 cm Tube secured with: Tape Dental Injury: Teeth and Oropharynx as per pre-operative assessment

## 2016-01-21 NOTE — Discharge Instructions (Addendum)
SU WOOI TEOH M.D., P.A. °Postoperative Instructions for Tonsillectomy & Adenoidectomy (T&A) °Activity °Restrict activity at home for the first two days, resting as much as possible. Light indoor activity is best. You may usually return to school or work within a week but void strenuous activity and sports for two weeks. Sleep with your head elevated on 2-3 pillows for 3-4 days to help decrease swelling. °Diet °Due to tissue swelling and throat discomfort, you may have little desire to drink for several days. However fluids are very important to prevent dehydration. You will find that non-acidic juices, soups, popsicles, Jell-O, custard, puddings, and any soft or mashed foods taken in small quantities can be swallowed fairly easily. Try to increase your fluid and food intake as the discomfort subsides. It is recommended that a child receive 1-1/2 quarts of fluid in a 24-hour period. Adult require twice this amount.  °Discomfort °Your sore throat may be relieved by applying an ice collar to your neck and/or by taking Tylenol®. You may experience an earache, which is due to referred pain from the throat. Referred ear pain is commonly felt at night when trying to rest. ° °Bleeding                        Although rare, there is risk of having some bleeding during the first 2 weeks after having a T&A. This usually happens between days 7-10 postoperatively. If you or your child should have any bleeding, try to remain calm. We recommend sitting up quietly in a chair and gently spitting out the blood into a bowl. For adults, gargling gently with ice water may help. If the bleeding does not stop after a short time (5 minutes), is more than 1 teaspoonful, or if you become worried, please call our office at [phone] or go directly to the nearest hospital emergency room. Do not eat or drink anything prior to going to the hospital as you may need to be taken to the operating room in order to control the bleeding. °GENERAL  CONSIDERATIONS °1. Brush your teeth regularly. Avoid mouthwashes and gargles for three weeks. You may gargle gently with warm salt-water as necessary or spray with Chloraseptic®. You may make salt-water by placing 2 teaspoons of table salt into a quart of fresh water. Warm the salt-water in a microwave to a luke warm temperature.  °2. Avoid exposure to colds and upper respiratory infections if possible.  °3. If you look into a mirror or into your child's mouth, you will see white-gray patches in the back of the throat. This is normal after having a T&A and is like a scab that forms on the skin after an abrasion. It will disappear once the back of the throat heals completely. However, it may cause a noticeable odor; this too will disappear with time. Again, warm salt-water gargles may be used to help keep the throat clean and promote healing.  °4. You may notice a temporary change in voice quality, such as a higher pitched voice or a nasal sound, until healing is complete. This may last for 1-2 weeks and should resolve.  °5. Do not take or give you child any medications that we have not prescribed or recommended.  °6. Snoring may occur, especially at night, for the first week after a T&A. It is due to swelling of the soft palate and will usually resolve.  °Please call our office at [phone] if you have any questions.   ° ° ° °  Post Anesthesia Home Care Instructions ° °Activity: °Get plenty of rest for the remainder of the day. A responsible adult should stay with you for 24 hours following the procedure.  °For the next 24 hours, DO NOT: °-Drive a car °-Operate machinery °-Drink alcoholic beverages °-Take any medication unless instructed by your physician °-Make any legal decisions or sign important papers. ° °Meals: °Start with liquid foods such as gelatin or soup. Progress to regular foods as tolerated. Avoid greasy, spicy, heavy foods. If nausea and/or vomiting occur, drink only clear liquids until the nausea  and/or vomiting subsides. Call your physician if vomiting continues. ° °Special Instructions/Symptoms: °Your throat may feel dry or sore from the anesthesia or the breathing tube placed in your throat during surgery. If this causes discomfort, gargle with warm salt water. The discomfort should disappear within 24 hours. ° °If you had a scopolamine patch placed behind your ear for the management of post- operative nausea and/or vomiting: ° °1. The medication in the patch is effective for 72 hours, after which it should be removed.  Wrap patch in a tissue and discard in the trash. Wash hands thoroughly with soap and water. °2. You may remove the patch earlier than 72 hours if you experience unpleasant side effects which may include dry mouth, dizziness or visual disturbances. °3. Avoid touching the patch. Wash your hands with soap and water after contact with the patch. °  ° °

## 2016-01-21 NOTE — Anesthesia Preprocedure Evaluation (Signed)

## 2016-01-22 ENCOUNTER — Encounter (HOSPITAL_BASED_OUTPATIENT_CLINIC_OR_DEPARTMENT_OTHER): Payer: Self-pay | Admitting: Otolaryngology

## 2020-01-24 DIAGNOSIS — R509 Fever, unspecified: Secondary | ICD-10-CM | POA: Diagnosis not present

## 2020-01-24 DIAGNOSIS — U071 COVID-19: Secondary | ICD-10-CM | POA: Diagnosis not present

## 2020-01-24 DIAGNOSIS — M791 Myalgia, unspecified site: Secondary | ICD-10-CM | POA: Diagnosis not present

## 2020-08-14 DIAGNOSIS — Z3481 Encounter for supervision of other normal pregnancy, first trimester: Secondary | ICD-10-CM | POA: Diagnosis not present

## 2020-08-14 DIAGNOSIS — O3680X0 Pregnancy with inconclusive fetal viability, not applicable or unspecified: Secondary | ICD-10-CM | POA: Diagnosis not present

## 2020-09-11 DIAGNOSIS — Z3482 Encounter for supervision of other normal pregnancy, second trimester: Secondary | ICD-10-CM | POA: Diagnosis not present

## 2020-10-17 DIAGNOSIS — Z3689 Encounter for other specified antenatal screening: Secondary | ICD-10-CM | POA: Diagnosis not present

## 2020-10-17 DIAGNOSIS — Z369 Encounter for antenatal screening, unspecified: Secondary | ICD-10-CM | POA: Diagnosis not present

## 2020-10-30 ENCOUNTER — Ambulatory Visit: Payer: Self-pay | Attending: Internal Medicine

## 2020-10-30 ENCOUNTER — Other Ambulatory Visit (HOSPITAL_BASED_OUTPATIENT_CLINIC_OR_DEPARTMENT_OTHER): Payer: Self-pay | Admitting: Internal Medicine

## 2020-10-30 DIAGNOSIS — Z23 Encounter for immunization: Secondary | ICD-10-CM

## 2020-10-30 NOTE — Progress Notes (Signed)
   Covid-19 Vaccination Clinic  Name:  [name]    MRN: [id number] DOB: Apr 27, 1991  10/30/2020  [name] was observed post Covid-19 immunization for 15 minutes without incident. She was provided with Vaccine Information Sheet and instruction to access the V-Safe system.   [name] was instructed to call 911 with any severe reactions post vaccine: Marland Kitchen Difficulty breathing  . Swelling of face and throat  . A fast heartbeat  . A bad rash all over body  . Dizziness and weakness   Immunizations Administered    Name Date Dose VIS Date Route   Moderna Covid-19 Booster Vaccine 10/30/2020 12:43 PM 0.25 mL 08/29/2020 Intramuscular   Manufacturer: Gala Murdoch   Lot: [id number]   NDC: 17494-496-75

## 2020-11-22 DIAGNOSIS — Z20822 Contact with and (suspected) exposure to covid-19: Secondary | ICD-10-CM | POA: Diagnosis not present

## 2020-12-12 DIAGNOSIS — Z3482 Encounter for supervision of other normal pregnancy, second trimester: Secondary | ICD-10-CM | POA: Diagnosis not present

## 2020-12-12 DIAGNOSIS — Z362 Encounter for other antenatal screening follow-up: Secondary | ICD-10-CM | POA: Diagnosis not present

## 2020-12-27 DIAGNOSIS — Z23 Encounter for immunization: Secondary | ICD-10-CM | POA: Diagnosis not present

## 2020-12-27 DIAGNOSIS — Z3482 Encounter for supervision of other normal pregnancy, second trimester: Secondary | ICD-10-CM | POA: Diagnosis not present

## 2021-02-21 DIAGNOSIS — Z3483 Encounter for supervision of other normal pregnancy, third trimester: Secondary | ICD-10-CM | POA: Diagnosis not present

## 2021-03-08 DIAGNOSIS — O10913 Unspecified pre-existing hypertension complicating pregnancy, third trimester: Secondary | ICD-10-CM | POA: Diagnosis not present

## 2021-03-08 DIAGNOSIS — Z79899 Other long term (current) drug therapy: Secondary | ICD-10-CM | POA: Diagnosis not present

## 2021-03-08 DIAGNOSIS — O113 Pre-existing hypertension with pre-eclampsia, third trimester: Secondary | ICD-10-CM | POA: Diagnosis not present

## 2021-03-08 DIAGNOSIS — O99824 Streptococcus B carrier state complicating childbirth: Secondary | ICD-10-CM | POA: Diagnosis not present

## 2021-03-08 DIAGNOSIS — Z3A38 38 weeks gestation of pregnancy: Secondary | ICD-10-CM | POA: Diagnosis not present

## 2021-03-08 DIAGNOSIS — O2603 Excessive weight gain in pregnancy, third trimester: Secondary | ICD-10-CM | POA: Diagnosis not present

## 2021-03-09 DIAGNOSIS — O4290 Premature rupture of membranes, unspecified as to length of time between rupture and onset of labor, unspecified weeks of gestation: Secondary | ICD-10-CM | POA: Diagnosis not present

## 2021-03-09 DIAGNOSIS — Z3A39 39 weeks gestation of pregnancy: Secondary | ICD-10-CM | POA: Diagnosis not present

## 2021-03-25 DIAGNOSIS — K5900 Constipation, unspecified: Secondary | ICD-10-CM | POA: Diagnosis not present

## 2021-09-13 ENCOUNTER — Other Ambulatory Visit (HOSPITAL_BASED_OUTPATIENT_CLINIC_OR_DEPARTMENT_OTHER): Payer: Self-pay

## 2021-09-13 MED ORDER — FLUARIX QUADRIVALENT 0.5 ML IM SUSY
PREFILLED_SYRINGE | INTRAMUSCULAR | 0 refills | Status: DC
  Filled 2021-09-13: qty 0.5, 1d supply, fill #0

## 2021-11-19 DIAGNOSIS — Z124 Encounter for screening for malignant neoplasm of cervix: Secondary | ICD-10-CM | POA: Diagnosis not present

## 2021-11-19 DIAGNOSIS — Z01419 Encounter for gynecological examination (general) (routine) without abnormal findings: Secondary | ICD-10-CM | POA: Diagnosis not present

## 2021-11-19 DIAGNOSIS — Z3041 Encounter for surveillance of contraceptive pills: Secondary | ICD-10-CM | POA: Diagnosis not present

## 2021-11-19 LAB — HM PAP SMEAR

## 2022-01-28 ENCOUNTER — Ambulatory Visit (HOSPITAL_BASED_OUTPATIENT_CLINIC_OR_DEPARTMENT_OTHER): Payer: Self-pay | Admitting: Obstetrics & Gynecology

## 2022-03-12 ENCOUNTER — Encounter (HOSPITAL_BASED_OUTPATIENT_CLINIC_OR_DEPARTMENT_OTHER): Payer: Self-pay | Admitting: Nurse Practitioner

## 2022-03-12 ENCOUNTER — Ambulatory Visit (INDEPENDENT_AMBULATORY_CARE_PROVIDER_SITE_OTHER): Payer: 59 | Admitting: Nurse Practitioner

## 2022-03-12 VITALS — BP 128/88 | HR 99 | Ht 65.0 in | Wt 182.2 lb

## 2022-03-12 DIAGNOSIS — N920 Excessive and frequent menstruation with regular cycle: Secondary | ICD-10-CM | POA: Diagnosis not present

## 2022-03-12 DIAGNOSIS — Z7689 Persons encountering health services in other specified circumstances: Secondary | ICD-10-CM | POA: Diagnosis not present

## 2022-03-12 DIAGNOSIS — Z0000 Encounter for general adult medical examination without abnormal findings: Secondary | ICD-10-CM

## 2022-03-12 NOTE — Progress Notes (Signed)
Kristina EthSara E Jaxsyn Catalfamo, DNP, AGNP-c ?Primary Care & Sports Medicine ?[address]  [address] ?[place], [place] [address] ?[phone] ? ?New patient visit ? ? ?Patient: Kristina Galvan   DOB: 07/06/1991   30 y.o. Female  MRN: [id number] ?Visit Date: 03/12/2022 ? ?Patient Care Team: ?Amara Justen, Sung AmabileSara E, NP as PCP - General (Nurse Practitioner) ? ?Today's Vitals  ? 03/12/22 0912  ?BP: 128/88  ?Pulse: 99  ?SpO2: 99%  ?Weight: 182 lb 3.2 oz (82.6 kg)  ?Height: 5\' 5"  (1.651 m)  ? ?Body mass index is 30.32 kg/m?.  ? ?Today's healthcare provider: Tollie EthSara E. Deniya Craigo, NP  ? ?Chief Complaint  ?Patient presents with  ? Establish Care  ? ?Subjective  ?  ?Kristina Galvan is a 31 y.o. female who presents today as a new patient to establish care.  ?  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 ?History reviewed and reveals the following: ?Past Medical History:  ?Diagnosis Date  ? Adenotonsillar hypertrophy 01/09/2016  ? Dental crown present   ? ?Past Surgical History:  ?Procedure Laterality Date  ? FRACTURE SURGERY  02/2007  ? HAND TENDON SURGERY Right   ? torn tendon thumb  ? TONSILLECTOMY AND ADENOIDECTOMY Bilateral 01/21/2016  ? Procedure: TONSILLECTOMY AND ADENOIDECTOMY;  Surgeon: Newman PiesSu Teoh, MD;  Location: [facility];  Service: ENT;  Laterality: Bilateral;  ? WISDOM TOOTH EXTRACTION    ? ?Family Status  ?Relation Name Status  ? PGM  (Not Specified)  ? MGM  (Not Specified)  ? Father  (Not Specified)  ? Mother  (Not Specified)  ? ?Family History  ?Problem Relation Age of Onset  ? Hypertension Paternal Grandmother   ? Hypertension Maternal Grandmother   ? Deep vein thrombosis Father   ? Diabetes Father   ? Hypertension Mother   ? ?Social History  ? ?Socioeconomic History  ? Marital status: Married  ?  Spouse name: Not  on file  ? Number of children: Not on file  ? Years of education: Not on file  ? Highest education level: Not on file  ?Occupational History  ? Not on file  ?Tobacco Use  ? Smoking status: Never  ? Smokeless tobacco: Never  ?Substance and Sexual Activity  ? Alcohol use: No  ? Drug use: No  ? Sexual activity: Yes  ?  Birth control/protection: Abstinence, Pill  ?  Comment: ORSYTHIA 30  ?Other Topics Concern  ? Not on file  ?Social History Narrative  ? Not on file  ? ?Social Determinants of Health  ? ?Financial Resource Strain: Not on file  ?Food Insecurity: Not on file  ?Transportation Needs: Not on file  ?Physical Activity: Not on file  ?Stress: Not on file  ?Social Connections: Not on file  ? ?Outpatient Medications Prior to Visit  ?Medication Sig  ? [DISCONTINUED] levonorgestrel-ethinyl estradiol (AVIANE,ALESSE,LESSINA) 0.1-20 MG-MCG tablet PT TO TAKE 1 TABLET BY MOUTH X 12 CONSECUTIVE WEEKS FOLLOWED BY 3 DAYS OF INACTIVE PILLS; THEN START THE 12 WEEKS CYCLE ONCE AGAIN.  ? influenza vac split quadrivalent PF (FLUARIX QUADRIVALENT) 0.5 ML injection Inject into the muscle. (Patient not taking: Reported on 03/12/2022)  ? [DISCONTINUED] oxyCODONE (ROXICODONE) 5 MG/5ML solution Take 5-10 mLs (5-10 mg total) by mouth every 4 (four) hours as needed for  severe pain. (Patient not taking: Reported on 03/12/2022)  ? ?No facility-administered medications prior to visit.  ? ?No Known Allergies ?Immunization History  ?Administered Date(s) Administered  ? Moderna SARS-COV2 Booster Vaccination 10/30/2020  ? Moderna Sars-Covid-2 Vaccination 01/31/2020, 02/28/2020, 10/31/2020  ? Tdap 12/27/2020  ? ? ?Review of Systems ?All review of systems negative except what is listed in the HPI ? ? Objective  ?  ?BP 128/88   Pulse 99   Ht 5\' 5"  (1.651 m)   Wt 182 lb 3.2 oz (82.6 kg)   SpO2 99%   BMI 30.32 kg/m?  ?Physical Exam ?Vitals and nursing note reviewed.  ?Constitutional:   ?   General: She is not in acute distress. ?   Appearance:  Normal appearance.  ?HENT:  ?   Head: Normocephalic and atraumatic.  ?   Right Ear: Hearing, tympanic membrane, ear canal and external ear normal.  ?   Left Ear: Hearing, tympanic membrane, ear canal and external ear normal.  ?   Nose: Nose normal.  ?   Right Sinus: No maxillary sinus tenderness or frontal sinus tenderness.  ?   Left Sinus: No maxillary sinus tenderness or frontal sinus tenderness.  ?   Mouth/Throat:  ?   Lips: Pink.  ?   Mouth: Mucous membranes are moist.  ?   Pharynx: Oropharynx is clear.  ?Eyes:  ?   General: Lids are normal. Vision grossly intact.  ?   Extraocular Movements: Extraocular movements intact.  ?   Conjunctiva/sclera: Conjunctivae normal.  ?   Pupils: Pupils are equal, round, and reactive to light.  ?   Funduscopic exam: ?   Right eye: Red reflex present.     ?   Left eye: Red reflex present. ?   Visual Fields: Right eye visual fields normal and left eye visual fields normal.  ?Neck:  ?   Thyroid: No thyromegaly.  ?   Vascular: No carotid bruit.  ?Cardiovascular:  ?   Rate and Rhythm: Normal rate and regular rhythm.  ?   Chest Wall: PMI is not displaced.  ?   Pulses: Normal pulses.     ?     Dorsalis pedis pulses are 2+ on the right side and 2+ on the left side.  ?     Posterior tibial pulses are 2+ on the right side and 2+ on the left side.  ?   Heart sounds: Normal heart sounds. No murmur heard. ?Pulmonary:  ?   Effort: Pulmonary effort is normal. No respiratory distress.  ?   Breath sounds: Normal breath sounds.  ?Abdominal:  ?   General: Abdomen is flat. Bowel sounds are normal. There is no distension.  ?   Palpations: Abdomen is soft. There is no hepatomegaly, splenomegaly or mass.  ?   Tenderness: There is no abdominal tenderness. There is no right CVA tenderness, left CVA tenderness, guarding or rebound.  ?Musculoskeletal:     ?   General: Normal range of motion.  ?   Cervical back: Full passive range of motion without pain, normal range of motion and neck supple. No  tenderness.  ?   Right lower leg: No edema.  ?   Left lower leg: No edema.  ?Feet:  ?   Left foot:  ?   Toenail Condition: Left toenails are normal.  ?Lymphadenopathy:  ?   Cervical: No cervical adenopathy.  ?   Upper Body:  ?   Right upper body: No supraclavicular adenopathy.  ?  Left upper body: No supraclavicular adenopathy.  ?Skin: ?   General: Skin is warm and dry.  ?   Capillary Refill: Capillary refill takes less than 2 seconds.  ?   Nails: There is no clubbing.  ?Neurological:  ?   General: No focal deficit present.  ?   Mental Status: She is alert and oriented to person, place, and time.  ?   GCS: GCS eye subscore is 4. GCS verbal subscore is 5. GCS motor subscore is 6.  ?   Cranial Nerves: No cranial nerve deficit.  ?   Sensory: Sensation is intact. No sensory deficit.  ?   Motor: Motor function is intact. No weakness.  ?   Coordination: Coordination is intact.  ?   Gait: Gait is intact. Gait normal.  ?   Deep Tendon Reflexes: Reflexes are normal and symmetric.  ?Psychiatric:     ?   Attention and Perception: Attention normal.     ?   Mood and Affect: Mood normal.     ?   Speech: Speech normal.     ?   Behavior: Behavior normal. Behavior is cooperative.     ?   Thought Content: Thought content normal.     ?   Cognition and Memory: Cognition and memory normal.     ?   Judgment: Judgment normal.  ? ? ?Results for orders placed or performed in visit on 03/12/22  ?HM PAP SMEAR  ?Result Value Ref Range  ? HM Pap smear NILM   ?Results Console HPV  ?Result Value Ref Range  ? CHL HPV Negative   ? ? Assessment & Plan   ?  ? ?Problem List Items Addressed This Visit   ? ? Menorrhagia  ?  Hx of heavy menses. Currently on POP. Last GYN visit normal. Daughter 1 y/o. ?No alarm sx present at this time. Will review labs today for further evaluation.  ? ?  ?  ? Encounter for annual physical exam  ?  CPE today with no alarm findings. Will obtain labs today for baseline observation.  ?Recommend healthy diet and activity of  at least 150 minutes per week ?Recommend safe sexual practices ?Avoid prolonged sunlight ?F/U in 1 year ? ?  ?  ? ?Other Visit Diagnoses   ? ? Encounter to establish care    -  Primary  ? Health maintenance examination

## 2022-03-12 NOTE — Patient Instructions (Signed)
Thank you for choosing [facility] at [facility] for your Primary Care needs. I am excited for the opportunity to partner with you to meet your health care goals. It was a pleasure meeting you today! ? ?Recommendations from today's visit: ?We will get some labs today and make sure everything looks ok ?I will be in touch with you within a week from the time they return to let you know my thoughts and make any suggestions.  ? ?Information on diet, exercise, and health maintenance recommendations are listed below. This is information to help you be sure you are on track for optimal health and monitoring.  ? ?Please look over this and let Kristina Galvan know if you have any questions or if you have completed any of the health maintenance outside of [employer] so that we can be sure your records are up to date.  ?___________________________________________________________ ?About Me: ?I am an Adult-Geriatric Nurse Practitioner with a background in caring for patients for more than 20 years with a strong intensive care background. I provide primary care and sports medicine services to patients age 21 and older within this office. My education had a strong focus on caring for the older adult population, which I am passionate about. I am also the director of the APP Fellowship with [facility]  ? ?My desire is to provide you with the best service through preventive medicine and supportive care. I consider you a part of the medical team and value your input. I work diligently to ensure that you are heard and your needs are met in a safe and effective manner. I want you to feel comfortable with me as your provider and want you to know that your health concerns are important to me. ? ?For your information, our office hours are: ?Monday, Tuesday, and Thursday 8:00 AM - 5:00 PM ?Wednesday and Friday 8:00 AM - 12:00 PM.  ? ?In my time away from the office I am teaching new APP's within the system and am unavailable, but  my partner, Dr. Burnard Bunting is in the office for emergent needs.  ? ?If you have questions or concerns, please call our office at [phone] or send Kristina Galvan a MyChart message and we will respond as quickly as possible.  ?____________________________________________________________ ?MyChart:  ?For all urgent or time sensitive needs we ask that you please call the office to avoid delays. Our number is [phone]. ?MyChart is not constantly monitored and due to the large volume of messages a day, replies may take up to 72 business hours. ? ?MyChart Policy: ?MyChart allows for you to see your visit notes, after visit summary, provider recommendations, lab and tests results, make an appointment, request refills, and contact your provider or the office for non-urgent questions or concerns. Providers are seeing patients during normal business hours and do not have built in time to review MyChart messages.  ?We ask that you allow a minimum of 3 business days for responses to [employer]. For this reason, please do not send urgent requests through [place]. Please call the office at [phone]. ?New and ongoing conditions may require a visit. We have virtual and in person visit available for your convenience.  ?Complex MyChart concerns may require a visit. Your provider may request you schedule a virtual or in person visit to ensure we are providing the best care possible. ?MyChart messages sent after 11:00 AM on Friday will not be received by the provider until Monday morning.  ?  ?Lab and Test  Results: ?You will receive your lab and test results on MyChart as soon as they are completed and results have been sent by the lab or testing facility. Due to this service, you will receive your results BEFORE your provider.  ?I review lab and tests results each morning prior to seeing patients. Some results require collaboration with other providers to ensure you are receiving the most appropriate care. For this reason, we ask  that you please allow a minimum of 3-5 business days from the time the ALL results have been received for your provider to receive and review lab and test results and contact you about these.  ?Most lab and test result comments from the provider will be sent through [place]. Your provider may recommend changes to the plan of care, follow-up visits, repeat testing, ask questions, or request an office visit to discuss these results. You may reply directly to this message or call the office at [phone] to provide information for the provider or set up an appointment. ?In some instances, you will be called with test results and recommendations. Please let Kristina Galvan know if this is preferred and we will make note of this in your chart to provide this for you.    ?If you have not heard a response to your lab or test results in 5 business days from all results returning to [facility], please call the office to let Kristina Galvan know. We ask that you please avoid calling prior to this time unless there is an emergent concern. Due to high call volumes, this can delay the resulting process. ? ?After Hours: ?For all non-emergency after hours needs, please call the office at [phone] and select the option to reach the on-call provider service. On-call services are shared between multiple [place] offices and therefore it will not be possible to speak directly with your provider. On-call providers may provide medical advice and recommendations, but are unable to provide refills for maintenance medications.  ?For all emergency or urgent medical needs after normal business hours, we recommend that you seek care at the closest Urgent Care or Emergency Department to ensure appropriate treatment in a timely manner.  ?[facility] [place] has a 24 hour emergency room located on the ground floor for your convenience.  ? ?Urgent Concerns During the Business Day ?Providers are seeing patients from 8AM to [place] with a busy schedule and  are most often not able to respond to non-urgent calls until the end of the day or the next business day. ?If you should have URGENT concerns during the day, please call and speak to the nurse or schedule a same day appointment so that we can address your concern without delay.  ? ?Thank you, again, for choosing me as your health care partner. I appreciate your trust and look forward to learning more about you.  ? ?Worthy Keeler, DNP, AGNP-c ?___________________________________________________________ ? ?Health Maintenance Recommendations ?Screening Testing ?Mammogram ?Every 1 -2 years based on history and risk factors ?Starting at age 4 ?Pap Smear ?Ages 21-39 every 3 years ?Ages 14-65 every 5 years with HPV testing ?More frequent testing may be required based on results and history ?Colon Cancer Screening ?Every 1-10 years based on test performed, risk factors, and history ?Starting at age 6 ?Bone Density Screening ?Every 2-10 years based on history ?Starting at age 35 for women ?Recommendations for men differ based on medication usage, history, and risk factors ?AAA Screening ?One time ultrasound ?Men 87-8 years old who have every smoked ?Lung  Cancer Screening ?Low Dose Lung CT every 12 months ?Age 54-80 years with a 30 pack-year smoking history who still smoke or who have quit within the last 15 years ? ?Screening Labs ?Routine  Labs: Complete Blood Count (CBC), Complete Metabolic Panel (CMP), Cholesterol (Lipid Panel) ?Every 6-12 months based on history and medications ?May be recommended more frequently based on current conditions or previous results ?Hemoglobin A1c Lab ?Every 3-12 months based on history and previous results ?Starting at age 70 or earlier with diagnosis of diabetes, high cholesterol, BMI >26, and/or risk factors ?Frequent monitoring for patients with diabetes to ensure blood sugar control ?Thyroid Panel (TSH w/ T3 & T4) ?Every 6 months based on history, symptoms, and risk factors ?May be  repeated more often if on medication ?HIV ?One time testing for all patients 43 and older ?May be repeated more frequently for patients with increased risk factors or exposure ?Hepatitis C ?One time tes

## 2022-03-20 DIAGNOSIS — Z0000 Encounter for general adult medical examination without abnormal findings: Secondary | ICD-10-CM | POA: Insufficient documentation

## 2022-03-20 LAB — CBC WITH DIFFERENTIAL/PLATELET
Basophils Absolute: 0 10*3/uL (ref 0.0–0.2)
Basos: 0 %
EOS (ABSOLUTE): 0 10*3/uL (ref 0.0–0.4)
Eos: 1 %
Hematocrit: 38.5 % (ref 34.0–46.6)
Hemoglobin: 12.6 g/dL (ref 11.1–15.9)
Immature Grans (Abs): 0 10*3/uL (ref 0.0–0.1)
Immature Granulocytes: 0 %
Lymphocytes Absolute: 2.9 10*3/uL (ref 0.7–3.1)
Lymphs: 38 %
MCH: 32 pg (ref 26.6–33.0)
MCHC: 32.7 g/dL (ref 31.5–35.7)
MCV: 98 fL — ABNORMAL HIGH (ref 79–97)
Monocytes Absolute: 0.4 10*3/uL (ref 0.1–0.9)
Monocytes: 5 %
Neutrophils Absolute: 4.3 10*3/uL (ref 1.4–7.0)
Neutrophils: 56 %
Platelets: 268 10*3/uL (ref 150–450)
RBC: 3.94 x10E6/uL (ref 3.77–5.28)
RDW: 12.2 % (ref 11.7–15.4)
WBC: 7.7 10*3/uL (ref 3.4–10.8)

## 2022-03-20 LAB — COMPREHENSIVE METABOLIC PANEL
ALT: 10 IU/L (ref 0–32)
AST: 14 IU/L (ref 0–40)
Albumin/Globulin Ratio: 1.7 (ref 1.2–2.2)
Albumin: 4.8 g/dL (ref 3.9–5.0)
Alkaline Phosphatase: 81 IU/L (ref 44–121)
BUN/Creatinine Ratio: 10 (ref 9–23)
BUN: 9 mg/dL (ref 6–20)
Bilirubin Total: 0.3 mg/dL (ref 0.0–1.2)
CO2: 24 mmol/L (ref 20–29)
Calcium: 9.6 mg/dL (ref 8.7–10.2)
Chloride: 100 mmol/L (ref 96–106)
Creatinine, Ser: 0.94 mg/dL (ref 0.57–1.00)
Globulin, Total: 2.9 g/dL (ref 1.5–4.5)
Glucose: 86 mg/dL (ref 70–99)
Potassium: 4.5 mmol/L (ref 3.5–5.2)
Sodium: 138 mmol/L (ref 134–144)
Total Protein: 7.7 g/dL (ref 6.0–8.5)
eGFR: 85 mL/min/{1.73_m2} (ref >59)

## 2022-03-20 LAB — T4, FREE: Free T4: 1.4 ng/dL (ref 0.82–1.77)

## 2022-03-20 LAB — VITAMIN D 1,25 DIHYDROXY
Vitamin D 1, 25 (OH)2 Total: 108 pg/mL — ABNORMAL HIGH
Vitamin D2 1, 25 (OH)2: <10 pg/mL
Vitamin D3 1, 25 (OH)2: 99 pg/mL

## 2022-03-20 LAB — LIPID PANEL
Chol/HDL Ratio: 3.4 ratio (ref 0.0–4.4)
Cholesterol, Total: 134 mg/dL (ref 100–199)
HDL: 39 mg/dL — ABNORMAL LOW (ref >39)
LDL Chol Calc (NIH): 67 mg/dL (ref 0–99)
Triglycerides: 161 mg/dL — ABNORMAL HIGH (ref 0–149)
VLDL Cholesterol Cal: 28 mg/dL (ref 5–40)

## 2022-03-20 LAB — TSH: TSH: 0.8 u[IU]/mL (ref 0.450–4.500)

## 2022-03-20 NOTE — Assessment & Plan Note (Signed)
CPE today with no alarm findings. Will obtain labs today for baseline observation.  ?Recommend healthy diet and activity of at least 150 minutes per week ?Recommend safe sexual practices ?Avoid prolonged sunlight ?F/U in 1 year ?

## 2022-03-20 NOTE — Assessment & Plan Note (Signed)
Hx of heavy menses. Currently on POP. Last GYN visit normal. Daughter 29 y/o. ?No alarm sx present at this time. Will review labs today for further evaluation.  ?

## 2022-03-25 ENCOUNTER — Ambulatory Visit (HOSPITAL_BASED_OUTPATIENT_CLINIC_OR_DEPARTMENT_OTHER): Payer: 59 | Admitting: Advanced Practice Midwife

## 2022-05-08 ENCOUNTER — Ambulatory Visit (HOSPITAL_BASED_OUTPATIENT_CLINIC_OR_DEPARTMENT_OTHER): Payer: 59 | Admitting: Nurse Practitioner

## 2022-05-16 ENCOUNTER — Ambulatory Visit (HOSPITAL_BASED_OUTPATIENT_CLINIC_OR_DEPARTMENT_OTHER): Payer: 59 | Admitting: Nurse Practitioner

## 2022-07-07 ENCOUNTER — Ambulatory Visit (HOSPITAL_BASED_OUTPATIENT_CLINIC_OR_DEPARTMENT_OTHER): Payer: 59 | Admitting: Nurse Practitioner

## 2022-07-09 DIAGNOSIS — M25552 Pain in left hip: Secondary | ICD-10-CM | POA: Diagnosis not present

## 2022-07-09 DIAGNOSIS — M7062 Trochanteric bursitis, left hip: Secondary | ICD-10-CM | POA: Diagnosis not present

## 2022-07-09 DIAGNOSIS — M545 Low back pain, unspecified: Secondary | ICD-10-CM | POA: Diagnosis not present

## 2022-08-11 ENCOUNTER — Ambulatory Visit (HOSPITAL_BASED_OUTPATIENT_CLINIC_OR_DEPARTMENT_OTHER): Payer: 59 | Admitting: Nurse Practitioner

## 2022-08-11 ENCOUNTER — Encounter (HOSPITAL_BASED_OUTPATIENT_CLINIC_OR_DEPARTMENT_OTHER): Payer: Self-pay | Admitting: Nurse Practitioner

## 2022-08-11 VITALS — BP 138/96 | HR 99 | Ht 65.0 in | Wt 187.0 lb

## 2022-08-11 DIAGNOSIS — Z23 Encounter for immunization: Secondary | ICD-10-CM | POA: Diagnosis not present

## 2022-08-11 DIAGNOSIS — N9411 Superficial (introital) dyspareunia: Secondary | ICD-10-CM | POA: Diagnosis not present

## 2022-08-11 HISTORY — DX: Superficial (introital) dyspareunia: N94.11

## 2022-08-11 MED ORDER — DROSPIRENONE-ETHINYL ESTRADIOL 3-0.03 MG PO TABS: 1.0000 | ORAL_TABLET | Freq: Every day | ORAL | 3 refills | Status: DC

## 2022-08-11 NOTE — Patient Instructions (Addendum)
I recommend using a lubricant like Astroglide as this can stay moist longer than some of the gel type lubricants.   Aquaphor ointment applied daily to the vaginal area and just inside the vagina can really help to keep the area moist and also help heal the area if there is any irritation.   I have sent in a different birth control for you to try. Hopefully all of this will help take the discomfort and improve the vaginal moisture.

## 2022-08-11 NOTE — Progress Notes (Signed)
  Orma Render, DNP, AGNP-c Primary Care & Sports Medicine [address]  [place], [place] [address] [phone]  Subjective:   Kristina Galvan is a 31 y.o. female presents to day for evaluation of: Follow-up (Patient presents today for vaginal dryness and pain during intercourse X 53months. Patient would like a flu shot while in the office. )  Vaginal dryness and discomfort with intercourse She endorses vaginal dryness and discomfort at the introitus with intercourse.  She tells me that the symptoms started after the birth of her daughter however, appear to be worsening.    She tells me that she and her partner are using lubrication with intercourse however she is still experiencing a burning sensation at the vaginal opening and she reports discomfort as lubrication wears off.  She is currently on oral contraceptives which were started after the birth of her daughter 5 months ago.  PMH, Medications, and Allergies reviewed and updated in chart as appropriate.   ROS negative except for what is listed in HPI. Objective:  BP (!) 138/96   Pulse 99   Ht 5\' 5"  (1.651 m)   Wt 187 lb (84.8 kg)   SpO2 99%   BMI 31.12 kg/m  Physical Exam Vitals and nursing note reviewed.  Constitutional:      Appearance: Normal appearance.  HENT:     Head: Normocephalic.  Eyes:     Extraocular Movements: Extraocular movements intact.     Pupils: Pupils are equal, round, and reactive to light.  Cardiovascular:     Rate and Rhythm: Normal rate and regular rhythm.     Pulses: Normal pulses.     Heart sounds: Normal heart sounds.  Pulmonary:     Effort: Pulmonary effort is normal.     Breath sounds: Normal breath sounds.  Skin:    General: Skin is warm and dry.     Capillary Refill: Capillary refill takes less than 2 seconds.  Neurological:     General: No focal deficit present.     Mental Status: She is alert and oriented to person, place, and time.  Psychiatric:         Mood and Affect: Mood normal.        Behavior: Behavior normal.        Thought Content: Thought content normal.        Judgment: Judgment normal.           Assessment & Plan:   Problem List Items Addressed This Visit     Introital dyspareunia - Primary    Vaginal dryness and discomfort during intercourse. At this time recommend use of aquaphor ointment to the vaginal introitus daily to help facilitate healing and lubrication. Discussion on use of water based lubricant with intercourse and suggestion for liquid rather than jelly base for improved results. Will change oral contraceptive to different form of progesterone to see if this is helpful for symptoms, as well. If no improvement, recommend evaluation with GYN.       Relevant Medications   drospirenone-ethinyl estradiol (YASMIN 28) 3-0.03 MG tablet   Other Visit Diagnoses     Flu vaccine need       Relevant Orders   Flu Vaccine QUAD 6+ mos PF IM (Fluarix Quad PF) (Completed)         Orma Render, DNP, AGNP-c 08/11/2022  7:00 PM    History, Medications, Surgery, SDOH, and Family History reviewed and updated as appropriate.

## 2022-08-11 NOTE — Assessment & Plan Note (Signed)
Vaginal dryness and discomfort during intercourse. At this time recommend use of aquaphor ointment to the vaginal introitus daily to help facilitate healing and lubrication. Discussion on use of water based lubricant with intercourse and suggestion for liquid rather than jelly base for improved results. Will change oral contraceptive to different form of progesterone to see if this is helpful for symptoms, as well. If no improvement, recommend evaluation with GYN.

## 2022-09-12 ENCOUNTER — Other Ambulatory Visit: Payer: Self-pay

## 2022-09-12 ENCOUNTER — Emergency Department (HOSPITAL_BASED_OUTPATIENT_CLINIC_OR_DEPARTMENT_OTHER): Payer: 59 | Admitting: Radiology

## 2022-09-12 ENCOUNTER — Emergency Department (HOSPITAL_BASED_OUTPATIENT_CLINIC_OR_DEPARTMENT_OTHER)
Admission: EM | Admit: 2022-09-12 | Discharge: 2022-09-12 | Disposition: A | Discharge status: Home / Self Care | Payer: 59 | Attending: Emergency Medicine | Admitting: Emergency Medicine

## 2022-09-12 ENCOUNTER — Encounter (HOSPITAL_BASED_OUTPATIENT_CLINIC_OR_DEPARTMENT_OTHER): Payer: Self-pay

## 2022-09-12 DIAGNOSIS — M545 Low back pain, unspecified: Secondary | ICD-10-CM | POA: Diagnosis not present

## 2022-09-12 DIAGNOSIS — M549 Dorsalgia, unspecified: Secondary | ICD-10-CM | POA: Diagnosis present

## 2022-09-12 DIAGNOSIS — M62838 Other muscle spasm: Secondary | ICD-10-CM | POA: Insufficient documentation

## 2022-09-12 DIAGNOSIS — Y92410 Unspecified street and highway as the place of occurrence of the external cause: Secondary | ICD-10-CM | POA: Insufficient documentation

## 2022-09-12 DIAGNOSIS — M546 Pain in thoracic spine: Secondary | ICD-10-CM | POA: Insufficient documentation

## 2022-09-12 DIAGNOSIS — M62830 Muscle spasm of back: Secondary | ICD-10-CM | POA: Diagnosis not present

## 2022-09-12 LAB — URINALYSIS, ROUTINE W REFLEX MICROSCOPIC
Bilirubin Urine: NEGATIVE
Glucose, UA: NEGATIVE mg/dL
Hgb urine dipstick: NEGATIVE
Nitrite: NEGATIVE
Specific Gravity, Urine: 1.024 (ref 1.005–1.030)
pH: 6.5 (ref 5.0–8.0)

## 2022-09-12 LAB — PREGNANCY, URINE: Preg Test, Ur: NEGATIVE

## 2022-09-12 MED ORDER — IBUPROFEN 600 MG PO TABS: 600.0000 mg | ORAL_TABLET | Freq: Four times a day (QID) | ORAL | 0 refills | Status: DC | PRN

## 2022-09-12 MED ORDER — METHOCARBAMOL 500 MG PO TABS: 500.0000 mg | ORAL_TABLET | Freq: Three times a day (TID) | ORAL | 0 refills | Status: DC | PRN

## 2022-09-12 MED ORDER — METHOCARBAMOL 500 MG PO TABS
500.0000 mg | ORAL_TABLET | Freq: Once | ORAL | Status: AC
  Administered 2022-09-12: 500 mg via ORAL
  Filled 2022-09-12: qty 1

## 2022-09-12 MED ORDER — LIDOCAINE 5 % EX PTCH
1.0000 | MEDICATED_PATCH | CUTANEOUS | Status: DC
  Administered 2022-09-12: 1 via TRANSDERMAL
  Filled 2022-09-12: qty 1

## 2022-09-12 MED ORDER — IBUPROFEN 800 MG PO TABS
800.0000 mg | ORAL_TABLET | Freq: Once | ORAL | Status: AC
  Administered 2022-09-12: 800 mg via ORAL
  Filled 2022-09-12: qty 1
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
## 2022-09-12 NOTE — ED Notes (Signed)
Patient transported to X-ray 
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
## 2022-09-19 ENCOUNTER — Encounter (HOSPITAL_BASED_OUTPATIENT_CLINIC_OR_DEPARTMENT_OTHER): Payer: Self-pay | Admitting: Nurse Practitioner

## 2022-09-19 ENCOUNTER — Ambulatory Visit (INDEPENDENT_AMBULATORY_CARE_PROVIDER_SITE_OTHER): Payer: 59 | Admitting: Nurse Practitioner

## 2022-09-19 VITALS — BP 127/89 | HR 96 | Ht 65.0 in | Wt 187.0 lb

## 2022-09-19 DIAGNOSIS — N12 Tubulo-interstitial nephritis, not specified as acute or chronic: Secondary | ICD-10-CM | POA: Insufficient documentation

## 2022-09-19 DIAGNOSIS — Z3202 Encounter for pregnancy test, result negative: Secondary | ICD-10-CM

## 2022-09-19 HISTORY — DX: Tubulo-interstitial nephritis, not specified as acute or chronic: N12

## 2022-09-19 LAB — POCT URINALYSIS DIP (CLINITEK)
Bilirubin, UA: NEGATIVE
Glucose, UA: NEGATIVE mg/dL
Ketones, POC UA: NEGATIVE mg/dL
Nitrite, UA: NEGATIVE
POC PROTEIN,UA: NEGATIVE
Spec Grav, UA: 1.025 (ref 1.010–1.025)
Urobilinogen, UA: 0.2 E.U./dL
pH, UA: 6 (ref 5.0–8.0)

## 2022-09-19 LAB — POCT URINE PREGNANCY: Preg Test, Ur: NEGATIVE

## 2022-09-19 MED ORDER — SULFAMETHOXAZOLE-TRIMETHOPRIM 800-160 MG PO TABS: 1.0000 | ORAL_TABLET | Freq: Two times a day (BID) | ORAL | 0 refills | Status: DC

## 2022-09-19 MED ORDER — ONDANSETRON HCL 8 MG PO TABS: 8.0000 mg | ORAL_TABLET | Freq: Three times a day (TID) | ORAL | 2 refills | Status: DC | PRN

## 2022-09-19 MED ORDER — FLUCONAZOLE 150 MG PO TABS: ORAL_TABLET | ORAL | 2 refills | Status: DC

## 2022-09-19 NOTE — Patient Instructions (Signed)
Take the antibiotic for at least 7 days. If all of your symptoms are gone, then you can stop, but if you are still having symptoms keep taking for the total of 14 days.   If you have any new or worsening symptoms please call.    My new office is with [facility], if you would like to follow you are more than welcome. It is located at [employer] in [place]. The number is [phone]. Of course you are welcome to stay here, as well :-)
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
## 2022-11-06 NOTE — Assessment & Plan Note (Signed)
Significant tenderness to the CVA bilaterally and suprapubic region with positive UA in office today. Will send treatment with antibiotics and PRN diflucan. Recommend completion of all antibiotics and follow up if symptoms fail to completely resolve.

## 2022-11-21 ENCOUNTER — Ambulatory Visit (HOSPITAL_BASED_OUTPATIENT_CLINIC_OR_DEPARTMENT_OTHER): Payer: 59 | Admitting: Obstetrics & Gynecology

## 2023-03-16 ENCOUNTER — Encounter (HOSPITAL_BASED_OUTPATIENT_CLINIC_OR_DEPARTMENT_OTHER): Payer: 59 | Admitting: Nurse Practitioner

## 2023-06-23 DIAGNOSIS — F4322 Adjustment disorder with anxiety: Secondary | ICD-10-CM | POA: Diagnosis not present

## 2023-07-09 DIAGNOSIS — F4322 Adjustment disorder with anxiety: Secondary | ICD-10-CM | POA: Diagnosis not present

## 2023-08-03 ENCOUNTER — Encounter: Payer: Self-pay | Admitting: Nurse Practitioner

## 2023-08-03 ENCOUNTER — Ambulatory Visit (INDEPENDENT_AMBULATORY_CARE_PROVIDER_SITE_OTHER): Payer: Self-pay | Admitting: Nurse Practitioner

## 2023-08-03 VITALS — BP 130/84 | HR 92 | Ht 66.0 in | Wt 167.0 lb

## 2023-08-03 DIAGNOSIS — R6882 Decreased libido: Secondary | ICD-10-CM | POA: Diagnosis not present

## 2023-08-03 DIAGNOSIS — L301 Dyshidrosis [pompholyx]: Secondary | ICD-10-CM

## 2023-08-03 DIAGNOSIS — L209 Atopic dermatitis, unspecified: Secondary | ICD-10-CM | POA: Insufficient documentation

## 2023-08-03 DIAGNOSIS — Z23 Encounter for immunization: Secondary | ICD-10-CM | POA: Diagnosis not present

## 2023-08-03 DIAGNOSIS — Z0000 Encounter for general adult medical examination without abnormal findings: Secondary | ICD-10-CM

## 2023-08-03 LAB — LP+LDL DIRECT

## 2023-08-03 MED ORDER — MOMETASONE FUROATE 0.1 % EX CREA: TOPICAL_CREAM | CUTANEOUS | 5 refills | Status: AC

## 2023-08-03 NOTE — Patient Instructions (Addendum)
Congratulations on your weight loss!! This is amazing!  Bacterial Vaginosis You can try Boric Acid capsules (available over the counter) that are placed inside the vagina for 5-7 nights in a row when symptoms occur. This can help to clear the bacterial growth.  You may also consider a probiotic. There is a brand called Femdophilus that many patients use and have good luck with. This is once a day taken by mouth. I know many patients order this from [employer].   Bacterial vaginosis is an infection of the vagina. It happens when too many normal germs (healthy bacteria) grow in the vagina. This infection can make it easier to get other infections from sex (STIs). It is very important for pregnant women to get treated. This infection can cause babies to be born Kristina Galvan or at a low birth weight. What are the causes? This infection is caused by an increase in certain germs that grow in the vagina. You cannot get this infection from toilet seats, bedsheets, swimming pools, or things that touch your vagina. What increases the risk? Having sex with a new person or more than one person. Having sex without protection. Douching. Having an intrauterine device (IUD). Smoking. Using drugs or drinking alcohol. These can lead you to do things that are risky. Taking certain antibiotic medicines. Being pregnant. What are the signs or symptoms? Some women have no symptoms. Symptoms may include: A discharge from your vagina. It may be gray or white. It can be watery or foamy. A fishy smell. This can happen after sex or during your menstrual period. Itching in and around your vagina. A feeling of burning or pain when you pee (urinate). How is this treated? This infection is treated with antibiotic medicines. These may be given to you as: A pill. A cream for your vagina. A medicine that you put into your vagina (suppository). If the infection comes back after treatment, you may need more antibiotics. Follow  these instructions at home: Medicines Take over-the-counter and prescription medicines as told by your doctor. Take or use your antibiotic medicine as told by your doctor. Do not stop taking or using it, even if you start to feel better. General instructions If the person you have sex with is a woman, tell her that you have this infection. She will need to follow up with her doctor. If you have a female partner, he does not need to be treated. Do not have sex until you finish treatment. Drink enough fluid to keep your pee pale yellow. Keep your vagina and butt clean. Wash the area with warm water each day. Wipe from front to back after you use the toilet. If you are breastfeeding a baby, ask your doctor if you should keep doing so during treatment. Keep all follow-up visits. How is this prevented? Self-care Do not douche. Use only warm water to wash around your vagina. Wear underwear that is cotton or lined with cotton. Do not wear tight pants and pantyhose, especially in the summer. Safe sex Use protection when you have sex. This includes: Use condoms. Use dental dams. This is a thin layer that protects the mouth during oral sex. Limit how many people you have sex with. To prevent this infection, it is best to have sex with just one person. Get tested for STIs. The person you have sex with should also get tested. Drugs and alcohol Do not smoke or use any products that contain nicotine or tobacco. If you need help quitting, ask your doctor.  Do not use drugs. Do not drink alcohol if: Your doctor tells you not to drink. You are pregnant, may be pregnant, or are planning to become pregnant. If you drink alcohol: Limit how much you have to 0-1 drink a day. Know how much alcohol is in your drink. In the [place] one drink equals one 12 oz bottle of beer (355 mL), one 5 oz glass of wine (148 mL), or one 1 oz glass of hard liquor (44 mL). Where to find more information Centers for Disease  Control and Prevention: FootballExhibition.com.br American Sexual Health Association: www.ashastd.org Office on [employer] Health: http://hoffman.com/ Contact a doctor if: Your symptoms do not get better, even after you are treated. You have more discharge or pain when you pee. You have a fever or chills. You have pain in your belly (abdomen) or in the area between your hips. You have pain with sex. You bleed from your vagina between menstrual periods. Summary This infection can happen when too many germs (bacteria) grow in the vagina. This infection can make it easier to get infections from sex (STIs). Treating this can lower that chance. Get treated if you are pregnant. This infection can cause babies to be born Kristina Galvan. Do not stop taking or using your antibiotic medicine, even if you start to feel better. This information is not intended to replace advice given to you by your health care provider. Make sure you discuss any questions you have with your health care provider. Document Revised: 04/26/2020 Document Reviewed: 04/26/2020 Elsevier Patient Education  2024 [employer].

## 2023-08-03 NOTE — Progress Notes (Signed)
Shawna Clamp, DNP, AGNP-c [facility] [address] [place], [place] [address] Main Office [phone]  BP 130/84   Pulse 92   Ht 5\' 6"  (1.676 m)   Wt 167 lb (75.8 kg)   LMP 07/29/2023   BMI 26.95 kg/m    Subjective:    Patient ID: Kristina Galvan, female    DOB: 1991/10/22, 32 y.o.   MRN: [id number]  HPI: Kristina Galvan is a 32 y.o. female presenting on 08/03/2023 for comprehensive medical examination.   Current medical concerns include: The patient presents with a chief complaint of skin issues on her hands, which she suspects to be eczema. She describes the condition as flaring up in specific areas, particularly between her fingers and on the sides of her hands. 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 She also mentions that she does her own nails at home using gel polish and acetone, which she believes may be contributing to the condition.  In addition to the skin issues, the patient reports a recent change in her menstrual cycle and pH balance since stopping birth control in February. She describes experiencing occasional yeast infections and a strong, metallic odor before her period starts. She has been managing these symptoms with over-the-counter treatments and home remedies, such as plain [place] yogurt.  The patient also mentions a significant weight loss of over 20 pounds, which she attributes to dietary changes, specifically cutting out sodas and reducing alcohol intake. She reports that these changes have resulted in improved energy levels.  Lastly, the patient brings up a decrease in her sex drive, which she believes may be due to exhaustion from work and parenting responsibilities. She expresses a willingness to explore potential hormonal or other underlying causes for this change.  Pertinent items are noted in HPI.  IMMUNIZATIONS:   Flu Vaccine: Flu  vaccine given today Prevnar 13: Prevnar 13 N/A for this patient Prevnar 20: Prevnar 20 N/A for this patient Pneumovax 23: Pneumovax 23 N/A for this patient Vac Shingrix: Shingrix N/A for this patient HPV: N/A or Aged Out Tetanus: Tetanus completed in the last 10 years COVID: Declined today. Information on where to obtain the vaccine was provided.  RSV: No  HEALTH MAINTENANCE: Pap Smear HM Status: is up to date Mammogram HM Status: N/A Colon Cancer Screening HM Status: N/A Bone Density HM Status: N/A STI Testing HM Status: was declined  Lung CT HM Status: N/A  Concerns with vision, hearing, or dentition: No  Dietary Habits: She has cut out soda and alcohol from her diet.   Most Recent Depression Screen:     08/03/2023    8:49 AM 03/12/2022    9:11 AM  Depression screen PHQ 2/9  Decreased Interest 0 0  Down, Depressed, Hopeless 0 0  PHQ - 2 Score 0 0   Most Recent Anxiety Screen:      No data to display         Most Recent Fall Screen:    08/03/2023    8:49 AM 03/12/2022    9:11 AM  Fall Risk   Falls in the past year? 0 0  Number falls in past yr: 0 0  Injury with Fall? 0 0  Risk for fall due to : No Fall Risks Other (Comment);No Fall Risks  Follow up Falls evaluation completed Falls evaluation completed;Education provided    Past medical history, surgical history, medications,  allergies, family history and social history reviewed with patient today and changes made to appropriate areas of the chart.  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   No current facility-administered medications on file prior to visit.   Surgical History:  Past Surgical History:  Procedure Laterality Date    FRACTURE SURGERY  02/2007   HAND TENDON SURGERY Right    torn tendon thumb   TONSILLECTOMY AND ADENOIDECTOMY Bilateral 01/21/2016   Procedure: TONSILLECTOMY AND ADENOIDECTOMY;  Surgeon: Newman Pies, MD;  Location: [facility];  Service: ENT;  Laterality: Bilateral;   WISDOM TOOTH EXTRACTION     Allergies:  No Known Allergies Family History:  Family History  Problem Relation Age of Onset   Hypertension Paternal Grandmother    Hypertension Maternal Grandmother    Deep vein thrombosis Father    Diabetes Father    Hypertension Mother        Objective:    BP 130/84   Pulse 92   Ht 5\' 6"  (1.676 m)   Wt 167 lb (75.8 kg)   LMP 07/29/2023   BMI 26.95 kg/m   Wt Readings from Last 3 Encounters:  08/03/23 167 lb (75.8 kg)  09/19/22 187 lb (84.8 kg)  09/12/22 186 lb 15.2 oz (84.8 kg)    Physical Exam Vitals and nursing note reviewed.  Constitutional:      General: She is not in acute distress.    Appearance: Normal appearance.  HENT:     Head: Normocephalic and atraumatic.     Right Ear: Hearing, tympanic membrane, ear canal and external ear normal.     Left Ear: Hearing, tympanic membrane, ear canal and external ear normal.     Nose: Nose normal.     Right Sinus: No maxillary sinus tenderness or frontal sinus tenderness.     Left Sinus: No maxillary sinus tenderness or frontal sinus tenderness.     Mouth/Throat:     Lips: Pink.     Mouth: Mucous membranes are moist.     Pharynx: Oropharynx is clear.  Eyes:     General: Lids are normal. Vision grossly intact.     Extraocular Movements: Extraocular movements intact.     Conjunctiva/sclera: Conjunctivae normal.     Pupils: Pupils are equal, round, and reactive to light.     Funduscopic exam:    Right eye: Red reflex present.        Left eye: Red reflex present.    Visual Fields: Right eye visual fields normal and left eye visual fields normal.  Neck:     Thyroid: No thyromegaly.     Vascular: No carotid bruit.   Cardiovascular:     Rate and Rhythm: Normal rate and regular rhythm.     Chest Wall: PMI is not displaced.     Pulses: Normal pulses.          Dorsalis pedis pulses are 2+ on the right side and 2+ on the left side.       Posterior tibial pulses are 2+ on the right side and 2+ on the left side.     Heart sounds: Normal heart sounds. No murmur heard. Pulmonary:     Effort: Pulmonary effort is normal. No respiratory distress.  Breath sounds: Normal breath sounds.  Abdominal:     General: Abdomen is flat. Bowel sounds are normal. There is no distension.     Palpations: Abdomen is soft. There is no hepatomegaly, splenomegaly or mass.     Tenderness: There is no abdominal tenderness. There is no right CVA tenderness, left CVA tenderness, guarding or rebound.  Musculoskeletal:        General: Normal range of motion.     Cervical back: Full passive range of motion without pain, normal range of motion and neck supple. No tenderness.     Right lower leg: No edema.     Left lower leg: No edema.  Feet:     Left foot:     Toenail Condition: Left toenails are normal.  Lymphadenopathy:     Cervical: No cervical adenopathy.     Upper Body:     Right upper body: No supraclavicular adenopathy.     Left upper body: No supraclavicular adenopathy.  Skin:    General: Skin is warm and dry.     Capillary Refill: Capillary refill takes less than 2 seconds.     Findings: Rash present.     Nails: There is no clubbing.  Neurological:     General: No focal deficit present.     Mental Status: She is alert and oriented to person, place, and time.     GCS: GCS eye subscore is 4. GCS verbal subscore is 5. GCS motor subscore is 6.     Sensory: Sensation is intact.     Motor: Motor function is intact.     Coordination: Coordination is intact.     Gait: Gait is intact.     Deep Tendon Reflexes: Reflexes are normal and symmetric.  Psychiatric:        Attention and Perception: Attention normal.        Mood  and Affect: Mood normal.        Speech: Speech normal.        Behavior: Behavior normal. Behavior is cooperative.        Thought Content: Thought content normal.        Cognition and Memory: Cognition and memory normal.        Judgment: Judgment normal.     Results for orders placed or performed in visit on 09/19/22  POCT urine pregnancy  Result Value Ref Range   Preg Test, Ur Negative Negative  POCT URINALYSIS DIP (CLINITEK)  Result Value Ref Range   Color, UA yellow yellow   Clarity, UA clear clear   Glucose, UA negative negative mg/dL   Bilirubin, UA negative negative   Ketones, POC UA negative negative mg/dL   Spec Grav, UA 1.914 7.829 - 1.025   Blood, UA trace-intact (A) negative   pH, UA 6.0 5.0 - 8.0   POC PROTEIN,UA negative negative, trace   Urobilinogen, UA 0.2 0.2 or 1.0 E.U./dL   Nitrite, UA Negative Negative   Leukocytes, UA Trace (A) Negative         Assessment & Plan:   Problem List Items Addressed This Visit     Encounter for annual physical exam - Primary    CPE completed today. Review of HM activities and recommendations discussed and provided on AVS. Anticipatory guidance, diet, and exercise recommendations provided. Medications, allergies, and hx reviewed and updated as necessary. Orders placed as listed below.  Plan: - Labs ordered. Will make changes as necessary based on results.  - I will review these results  and send recommendations via MyChart or a telephone call.  - F/U with CPE in 1 year or sooner for acute/chronic health needs as directed.        Relevant Orders   CBC with Differential/Platelet   CMP14+EGFR   Hemoglobin A1c   VITAMIN D 25 Hydroxy (Vit-D Deficiency, Fractures)   LP+LDL Direct   Dyshidrotic eczema    Noted on hands, particularly between fingers. Likely related to frequent hand washing and use of gel polish and acetone. Itching reported, particularly in the morning. -Prescribe topical medication for nightly use, mixed with  a thick cream. -Advise patient to wipe off any gel polish from skin before curing, and to clean outside of polish bottles. -Recommend washing hands and applying medication after doing nails.       Relevant Medications   mometasone (ELOCON) 0.1 % cream   Decreased libido    Patient reports decreased sex drive, likely related to fatigue from work and motherhood. No hormonal imbalances suspected at this time. -Advise patient to discuss workload and needs with partner. -Plan to review labs for any potential hormonal imbalances. - We can make changes as needed based on lab results.       Relevant Orders   Testosterone, Total, LC/MS/MS   Other Visit Diagnoses     Need for influenza vaccination       Relevant Orders   Flu vaccine trivalent PF, 6mos and older(Flulaval,Afluria,Fluarix,Fluzone) (Completed)          Follow up plan: Return in about 1 year (around 08/02/2024) for CPE.  NEXT PREVENTATIVE PHYSICAL DUE IN 1 YEAR.  PATIENT COUNSELING PROVIDED FOR ALL ADULT PATIENTS: A well balanced diet low in saturated fats, cholesterol, and moderation in carbohydrates.  This can be as simple as monitoring portion sizes and cutting back on sugary beverages such as soda and juice to start with.    Daily water consumption of at least 64 ounces.  Physical activity at least 180 minutes per week.  If just starting out, start 10 minutes a day and work your way up.   This can be as simple as taking the stairs instead of the elevator and walking 2-3 laps around the office  purposefully every day.   STD protection, partner selection, and regular testing if high risk.  Limited consumption of alcoholic beverages if alcohol is consumed. For men, I recommend no more than 14 alcoholic beverages per week, spread out throughout the week (max 2 per day). Avoid "binge" drinking or consuming large quantities of alcohol in one setting.  Please let me know if you feel you may need help with reduction or  quitting alcohol consumption.   Avoidance of nicotine, if used. Please let me know if you feel you may need help with reduction or quitting nicotine use.   Daily mental health attention. This can be in the form of 5 minute daily meditation, prayer, journaling, yoga, reflection, etc.  Purposeful attention to your emotions and mental state can significantly improve your overall wellbeing  and  Health.  Please know that I am here to help you with all of your health care goals and am happy to work with you to find a solution that works best for you.  The greatest advice I have received with any changes in life are to take it one step at a time, that even means if all you can focus on is the next 60 seconds, then do that and celebrate your victories.  With any changes in  life, you will have set backs, and that is OK. The important thing to remember is, if you have a set back, it is not a failure, it is an opportunity to try again! Screening Testing Mammogram Every 1 -2 years based on history and risk factors Starting at age 72 Pap Smear Ages 21-39 every 3 years Ages 53-65 every 5 years with HPV testing More frequent testing may be required based on results and history Colon Cancer Screening Every 1-10 years based on test performed, risk factors, and history Starting at age 69 Bone Density Screening Every 2-10 years based on history Starting at age 61 for women Recommendations for men differ based on medication usage, history, and risk factors AAA Screening One time ultrasound Men 38-35 years old who have every smoked Lung Cancer Screening Low Dose Lung CT every 12 months Age 73-80 years with a 30 pack-year smoking history who still smoke or who have quit within the last 15 years   Screening Labs Routine  Labs: Complete Blood Count (CBC), Complete Metabolic Panel (CMP), Cholesterol (Lipid Panel) Every 6-12 months based on history and medications May be recommended more frequently  based on current conditions or previous results Hemoglobin A1c Lab Every 3-12 months based on history and previous results Starting at age 41 or earlier with diagnosis of diabetes, high cholesterol, BMI >26, and/or risk factors Frequent monitoring for patients with diabetes to ensure blood sugar control Thyroid Panel (TSH) Every 6 months based on history, symptoms, and risk factors May be repeated more often if on medication HIV One time testing for all patients 47 and older May be repeated more frequently for patients with increased risk factors or exposure Hepatitis C One time testing for all patients 80 and older May be repeated more frequently for patients with increased risk factors or exposure Gonorrhea, Chlamydia Every 12 months for all sexually active persons 13-24 years Additional monitoring may be recommended for those who are considered high risk or who have symptoms Every 12 months for any woman on birth control, regardless of sexual activity PSA Men 22-77 years old with risk factors Additional screening may be recommended from age 32-69 based on risk factors, symptoms, and history  Vaccine Recommendations Tetanus Booster All adults every 10 years Flu Vaccine All patients 6 months and older every year COVID Vaccine All patients 12 years and older Initial dosing with booster May recommend additional booster based on age and health history HPV Vaccine 2 doses all patients age 13-26 Dosing may be considered for patients over 26 Shingles Vaccine (Shingrix) 2 doses all adults 55 years and older Pneumonia (Pneumovax 6) All adults 65 years and older May recommend earlier dosing based on health history One year apart from Prevnar 32 Pneumonia (Prevnar 54) All adults 65 years and older Dosed 1 year after Pneumovax 23 Pneumonia (Prevnar 20) One time alternative to the two dosing of 13 and 23 For all adults with initial dose of 23, 20 is recommended 1 year later For all  adults with initial dose of 13, 23 is still recommended as second option 1 year later

## 2023-08-03 NOTE — Assessment & Plan Note (Addendum)
Patient reports decreased sex drive, likely related to fatigue from work and motherhood. No hormonal imbalances suspected at this time. -Advise patient to discuss workload and needs with partner. -Plan to review labs for any potential hormonal imbalances. - We can make changes as needed based on lab results.

## 2023-08-03 NOTE — Assessment & Plan Note (Signed)

## 2023-08-03 NOTE — Assessment & Plan Note (Signed)
Noted on hands, particularly between fingers. Likely related to frequent hand washing and use of gel polish and acetone. Itching reported, particularly in the morning. -Prescribe topical medication for nightly use, mixed with a thick cream. -Advise patient to wipe off any gel polish from skin before curing, and to clean outside of polish bottles. -Recommend washing hands and applying medication after doing nails.

## 2023-08-04 LAB — CBC WITH DIFFERENTIAL/PLATELET
Basophils Absolute: 0 10*3/uL (ref 0.0–0.2)
Basos: 0 %
EOS (ABSOLUTE): 0.1 10*3/uL (ref 0.0–0.4)
Eos: 1 %
Hematocrit: 39.6 % (ref 34.0–46.6)
Hemoglobin: 12.7 g/dL (ref 11.1–15.9)
Immature Grans (Abs): 0 10*3/uL (ref 0.0–0.1)
Immature Granulocytes: 0 %
Lymphocytes Absolute: 2.5 10*3/uL (ref 0.7–3.1)
Lymphs: 36 %
MCH: 31 pg (ref 26.6–33.0)
MCHC: 32.1 g/dL (ref 31.5–35.7)
MCV: 97 fL (ref 79–97)
Monocytes Absolute: 0.4 10*3/uL (ref 0.1–0.9)
Monocytes: 5 %
Neutrophils Absolute: 4.1 10*3/uL (ref 1.4–7.0)
Neutrophils: 58 %
Platelets: 258 10*3/uL (ref 150–450)
RBC: 4.1 x10E6/uL (ref 3.77–5.28)
RDW: 12.8 % (ref 11.7–15.4)
WBC: 7 10*3/uL (ref 3.4–10.8)

## 2023-08-04 LAB — CMP14+EGFR
ALT: 14 IU/L (ref 0–32)
AST: 15 IU/L (ref 0–40)
Albumin: 4.7 g/dL (ref 4.0–5.0)
Alkaline Phosphatase: 73 IU/L (ref 44–121)
BUN/Creatinine Ratio: 16 (ref 9–23)
BUN: 14 mg/dL (ref 6–20)
Bilirubin Total: 0.4 mg/dL (ref 0.0–1.2)
CO2: 27 mmol/L (ref 20–29)
Calcium: 9.8 mg/dL (ref 8.7–10.2)
Chloride: 98 mmol/L (ref 96–106)
Creatinine, Ser: 0.85 mg/dL (ref 0.57–1.00)
Globulin, Total: 3.1 g/dL (ref 1.5–4.5)
Glucose: 86 mg/dL (ref 70–99)
Potassium: 4.4 mmol/L (ref 3.5–5.2)
Sodium: 138 mmol/L (ref 134–144)
Total Protein: 7.8 g/dL (ref 6.0–8.5)
eGFR: 95 mL/min/{1.73_m2} (ref >59)

## 2023-08-04 LAB — LP+LDL DIRECT
Cholesterol, Total: 181 mg/dL (ref 100–199)
HDL: 64 mg/dL (ref >39)
LDL Chol Calc (NIH): 96 mg/dL (ref 0–99)
LDL Direct: 96 mg/dL (ref 0–99)
Triglycerides: 117 mg/dL (ref 0–149)
VLDL Cholesterol Cal: 21 mg/dL (ref 5–40)

## 2023-08-04 LAB — TESTOSTERONE, TOTAL, LC/MS/MS

## 2023-08-04 LAB — HEMOGLOBIN A1C
Est. average glucose Bld gHb Est-mCnc: 114 mg/dL
Hgb A1c MFr Bld: 5.6 % (ref 4.8–5.6)

## 2023-08-04 LAB — VITAMIN D 25 HYDROXY (VIT D DEFICIENCY, FRACTURES): Vit D, 25-Hydroxy: 25.5 ng/mL — ABNORMAL LOW (ref 30.0–100.0)

## 2023-08-14 ENCOUNTER — Other Ambulatory Visit (INDEPENDENT_AMBULATORY_CARE_PROVIDER_SITE_OTHER): Payer: BC Managed Care – PPO

## 2023-08-14 DIAGNOSIS — Z23 Encounter for immunization: Secondary | ICD-10-CM | POA: Diagnosis not present

## 2023-08-20 ENCOUNTER — Other Ambulatory Visit: Payer: Self-pay | Admitting: Nurse Practitioner

## 2023-08-20 DIAGNOSIS — E559 Vitamin D deficiency, unspecified: Secondary | ICD-10-CM

## 2023-08-20 MED ORDER — VITAMIN D (ERGOCALCIFEROL) 1.25 MG (50000 UNIT) PO CAPS: 50000.0000 [IU] | ORAL_CAPSULE | ORAL | 3 refills | Status: DC

## 2023-09-30 ENCOUNTER — Encounter: Payer: Self-pay | Admitting: Medical

## 2023-09-30 ENCOUNTER — Telehealth: Payer: BC Managed Care – PPO | Admitting: Medical

## 2023-09-30 VITALS — Temp 98.8°F | Wt 167.0 lb

## 2023-09-30 DIAGNOSIS — R058 Other specified cough: Secondary | ICD-10-CM

## 2023-09-30 DIAGNOSIS — J988 Other specified respiratory disorders: Secondary | ICD-10-CM | POA: Diagnosis not present

## 2023-09-30 DIAGNOSIS — J3489 Other specified disorders of nose and nasal sinuses: Secondary | ICD-10-CM

## 2023-09-30 MED ORDER — HYDROCODONE BIT-HOMATROP MBR 5-1.5 MG/5ML PO SOLN: 5.0000 mL | Freq: Three times a day (TID) | ORAL | 0 refills | Status: AC | PRN

## 2023-09-30 MED ORDER — AMOXICILLIN 875 MG PO TABS: 875.0000 mg | ORAL_TABLET | Freq: Two times a day (BID) | ORAL | 0 refills | Status: AC

## 2023-09-30 NOTE — Progress Notes (Signed)
Subjective:     Patient ID: Kristina Galvan, female   DOB: 1991-04-15, 32 y.o.   MRN: [id number]  This visit type was conducted due to national recommendations for restrictions regarding the COVID-19 Pandemic (e.g. social distancing) in an effort to limit this patient's exposure and mitigate transmission in our community.  Due to their co-morbid illnesses, this patient is at least at moderate risk for complications without adequate follow up.  This format is felt to be most appropriate for this patient at this time.    Documentation for virtual audio and video telecommunications through [place] encounter:  The patient was located at home. The provider was located in the office. The patient did consent to this visit and is aware of possible charges through their insurance for this visit.  The other persons participating in this telemedicine service were none. Time spent on call was 20 minutes and in review of previous records 20 minutes total.  This virtual service is not related to other E/M service within previous 7 days.   HPI Chief Complaint  Patient presents with   Cough    Cough x6 days. Fever 100.4 since Sunday. Body aches. Bending over caused some pain in sinus area no other symptoms   Virtual consult for illness.  Has had symptoms 6 days.   Has had fever on and off up to 100.4  using tylenol for fever.  She notes cough, congestion in head and chest, but feels sinus pressure in head when bending over, some upper teeth ache.  No sore throat, no NVD, no wheezing or SOB.  No runny nose, has some ear pressure.  Using tylenol for fever, using robitussin for cough, sudafed for congestion.  No covid test, but daughter had sinus infection last wee, treatment for that.  No other aggravating or relieving factors. No other complaint.   Past Medical History:  Diagnosis Date   Adenotonsillar hypertrophy 01/09/2016   Dental crown present    Dysmenorrhea 04/20/2012   Introital dyspareunia  08/11/2022   Menorrhagia 04/20/2012   Pyelonephritis 09/19/2022   Current Outpatient Medications on File Prior to Visit  Medication Sig Dispense Refill   mometasone (ELOCON) 0.1 % cream Apply a thin layer at bedtime with a heavy cream to the rash while present. May apply once during the day, additional for itching. When rash is gone, apply 1-2 times a week for maintenance. 45 g 5   Vitamin D, Ergocalciferol, (DRISDOL) 1.25 MG (50000 UNIT) CAPS capsule Take 1 capsule (50,000 Units total) by mouth every 7 (seven) days. 12 capsule 3   No current facility-administered medications on file prior to visit.    Review of Systems As in subjective    Objective:   Physical Exam Due to coronavirus pandemic stay at home measures, patient visit was virtual and they were not examined in person.   Temp 98.8 F (37.1 C) Comment: with tylenlol  Wt 167 lb (75.8 kg)   BMI 26.95 kg/m   Gen: wd, wn, nad Mildly ill appearing No labored breathing or wheezing      Assessment:     Encounter Diagnoses  Name Primary?   Sinus pressure Yes   Cough productive of purulent sputum    Respiratory tract infection        Plan:     Discussed symptoms, concerns, continue to hydrate well.  Consider Mucinex instead of Sudafed to see if this helps a little bit better.  Begin amoxicillin.  Can use Hycodan for worse cough.  Discussed risk and benefits of medication.  Can continue Tylenol for fever.  Consider nasal saline flush as discussed.  If worse or not much improved over the next 3 to 4 days and let me know  [name] was seen today for cough.  Diagnoses and all orders for this visit:  Sinus pressure  Cough productive of purulent sputum  Respiratory tract infection  Other orders -     amoxicillin (AMOXIL) 875 MG tablet; Take 1 tablet (875 mg total) by mouth 2 (two) times daily for 10 days. -     HYDROcodone bit-homatropine (HYCODAN) 5-1.5 MG/5ML syrup; Take 5 mLs by mouth every 8 (eight) hours as needed  for up to 5 days for cough.  F/u prn

## 2023-10-06 ENCOUNTER — Ambulatory Visit: Payer: BC Managed Care – PPO | Admitting: Medical

## 2023-10-06 VITALS — BP 120/70 | HR 91 | Temp 98.1°F | Wt 168.8 lb

## 2023-10-06 DIAGNOSIS — H938X3 Other specified disorders of ear, bilateral: Secondary | ICD-10-CM

## 2023-10-06 DIAGNOSIS — R052 Subacute cough: Secondary | ICD-10-CM | POA: Diagnosis not present

## 2023-10-06 DIAGNOSIS — H6690 Otitis media, unspecified, unspecified ear: Secondary | ICD-10-CM

## 2023-10-06 MED ORDER — AMOXICILLIN-POT CLAVULANATE 875-125 MG PO TABS: 1.0000 | ORAL_TABLET | Freq: Two times a day (BID) | ORAL | 0 refills | Status: DC

## 2023-10-06 MED ORDER — NEOMYCIN-POLYMYXIN-HC 3.5-10000-1 OT SUSP: 3.0000 [drp] | Freq: Four times a day (QID) | OTIC | 0 refills | Status: DC

## 2023-10-06 MED ORDER — PREDNISONE 20 MG PO TABS: ORAL_TABLET | ORAL | 0 refills | Status: DC

## 2023-10-06 NOTE — Progress Notes (Signed)
Subjective:     Patient ID: Ezzard Standing, female   DOB: 09/30/1991, 30 y.o.   MRN: [id number]  HPI Chief Complaint  Patient presents with   pain in left ear    Pain in left ear, can't hear anything out of right ear.  Since Sunday.    Here for recheck.   We did a virtual consult 6 days ago for same.  Cough is improving but still has right ear pressure, clogged, left ear pain.  Cough improved.  still having some cough.  From last visit she noted initial fever, cough, congestion in head and chest, but feels sinus pressure in head when bending over, some upper teeth ache.  No sore throat, no NVD, no wheezing or SOB.   No other aggravating or relieving factors. No other complaint.   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 20 tablet 0   mometasone (ELOCON) 0.1 % cream Apply a thin layer at bedtime with a heavy cream to the rash while present. May apply once during the day, additional for itching. When rash is gone, apply 1-2 times a week for maintenance. 45 g 5   Vitamin D, Ergocalciferol, (DRISDOL) 1.25 MG (50000 UNIT) CAPS capsule Take 1 capsule (50,000 Units total) by mouth every 7 (seven) days. 12 capsule 3   No current facility-administered medications on file prior to visit.    Review of Systems As in subjective    Objective:   Physical Exam BP 120/70   Pulse 91   Temp 98.1 F (36.7 C)   Wt 168 lb 12.8 oz (76.6 kg)   SpO2 98%   BMI 27.25 kg/m   General appearence: alert, no distress, WD/WN,  HEENT: normocephalic, sclerae anicteric, TMs pearly, nares patent, no discharge or erythema, pharynx normal Oral  cavity: MMM, no lesions Neck: supple, no lymphadenopathy, no thyromegaly, no masses Heart: RRR, normal S1, S2, no murmurs Lungs: CTA bilaterally, no wheezes, rhonchi, or rales Abdomen: +bs, soft, non tender, non distended, no masses, no hepatomegaly, no splenomegaly Pulses: 2+ symmetric, upper and lower extremities, normal cap refill    Assessment: Encounter Diagnoses  Name Primary?   Subacute otitis media, unspecified otitis media type Yes   Subacute cough    Pressure sensation in both ears         Plan:     Stop Amoxicillin.  Change to Augmentin, add ear drop and oral prednisone below.  Continue to hydrate well.   Can continue mucinex or sudafed a few more days.  If worse or not much improved over the next 3 to 4 days and let me know  Marci was seen today for pain in left ear.  Diagnoses and all orders for this visit:  Subacute otitis media, unspecified otitis media type  Subacute cough  Pressure sensation in both ears  Other orders -     neomycin-polymyxin-hydrocortisone (CORTISPORIN) 3.5-10000-1 OTIC suspension; Place 3 drops into both ears 4 (four) times daily. -     predniSONE (DELTASONE) 20 MG tablet; 3 tablets today, 2 tablets tomorrow, 1 tablet the third day -     amoxicillin-clavulanate (AUGMENTIN) 875-125  MG tablet; Take 1 tablet by mouth 2 (two) times daily.   F/u prn

## 2024-01-25 ENCOUNTER — Ambulatory Visit: Payer: BC Managed Care – PPO | Admitting: Nurse Practitioner

## 2024-01-25 ENCOUNTER — Encounter: Payer: Self-pay | Admitting: Nurse Practitioner

## 2024-01-25 VITALS — BP 114/78 | HR 84 | Wt 165.2 lb

## 2024-01-25 DIAGNOSIS — L209 Atopic dermatitis, unspecified: Secondary | ICD-10-CM

## 2024-01-25 DIAGNOSIS — L301 Dyshidrosis [pompholyx]: Secondary | ICD-10-CM | POA: Diagnosis not present

## 2024-01-25 MED ORDER — PIMECROLIMUS 1 % EX CREA: TOPICAL_CREAM | Freq: Two times a day (BID) | CUTANEOUS | 3 refills | Status: DC

## 2024-01-25 NOTE — Patient Instructions (Signed)
 Management of Hand Eczema 1. Identify and Avoid Triggers Avoid harsh soaps, detergents, and hand sanitizers (use fragrance-free, gentle cleansers). Wear gloves when doing dishes or cleaning (cotton gloves under nitrile or vinyl gloves). Avoid excessive handwashing; pat dry instead of rubbing. Identify and minimize exposure to allergens (fragrances, preservatives, metals).  2. Moisturize Frequently Apply a thick, fragrance-free moisturizer (like petroleum jelly or a ceramide-based cream) after washing hands and throughout the day. Use ointment-based products at night and wear cotton gloves for deep hydration. Aquaphor at bedtime with gloves can be very helpful.   3. Use Medications as Needed Topical corticosteroids (low to mid-potency for mild cases, higher potency for flares; as prescribed). Calcineurin inhibitors (tacrolimus, pimecrolimus) if long-term steroid use is a concern. Antihistamines for itch control if needed.  4. Protect and Repair the Skin Barrier Use hand creams with ceramides, shea butter, or urea to strengthen the skin. Avoid extreme temperatures (cold, dry air can worsen eczema).  5. Treat Infections Promptly If cracks or open sores appear, monitor for signs of infection (redness, warmth, pus). Seek medical attention for suspected bacterial or fungal infections.  6. Consider UVB Light Therapy or Systemic Treatment If eczema is severe and unresponsive to topical treatments, UVB therapy, oral immunosuppressants, or biologics (like Dupilumab) may be considered.

## 2024-01-25 NOTE — Progress Notes (Signed)
  Tollie Eth, DNP, AGNP-c [facility] [address] [place], [place] [address] [phone]   ACUTE VISIT- ESTABLISHED PATIENT  Blood pressure 114/78, pulse 84, weight 165 lb 3.2 oz (74.9 kg).  Subjective:  HPI Kristina Galvan is a 33 y.o. female presents to day for evaluation of acute concern(s).   History of Present Illness ABCDE Kristina Galvan is a 33 year old female with eczema who presents with worsening symptoms on her hands.  She experiences worsening eczema symptoms on her hands, particularly during the winter months. Symptoms include itching, especially at night, and a burning sensation. No eczema is present elsewhere on her body, and she has not noticed any other significant changes in her condition.  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 Although she wears gloves during these activities, the exposure may contribute to her symptoms.  She has been using mometasone at bedtime for her eczema but does not use any additional creams or lotions at bedtime. The symptoms have persisted throughout the winter, which she notes is typically a worse time for her eczema.  She recalls a previous dermatology consultation in 2018 or 2019, but it has not been a major issue since then.She previously saw [facility].   ROS negative except for what is listed in HPI. History, Medications, Surgery, SDOH, and Family History reviewed and updated as appropriate.  Objective:  Physical Exam Vitals and nursing note reviewed.  Constitutional:      Appearance: Normal appearance.  Eyes:     Conjunctiva/sclera: Conjunctivae normal.  Pulmonary:     Effort: Pulmonary effort is normal.  Musculoskeletal:     Cervical back: Normal range of motion.     Right lower leg: No edema.     Left lower leg: No edema.  Skin:    General: Skin is warm and dry.     Capillary Refill: Capillary refill takes 2 to 3 seconds.     Findings: Rash present.      Comments: Bilateral scaling noted on the hands with fine scale.   Neurological:     Mental Status: She is alert and oriented to person, place, and time.     Sensory: No sensory deficit.     Motor: No weakness.  Psychiatric:        Mood and Affect: Mood normal.         Assessment & Plan:   Problem List Items Addressed This Visit     Atopic dermatitis of both hands - Primary   Chronic eczema primarily affecting the hands, exacerbated by frequent hand washing and exposure to cleaning agents. Symptoms include nocturnal itching and burning. Current treatment with mometasone at bedtime. Considering addition of non-steroidal cream (Elidel) for twice-daily use. Discussed use of cotton gloves with Aquaphor and medications at bedtime to enhance absorption. Advised on potential benefits of sunlight exposure for symptom relief. Informed consent included discussion of potential insurance issues with Elidel and plan to adjust treatment based on insurance approval. Referral to dermatology considered if symptoms worsen or do not improve. - Continue mometasone at bedtime. - Prescribe Elidel for twice-daily use, pending insurance approval. - Advise use of cotton gloves with Aquaphor and medications at bedtime. - Encourage sunlight exposure for hands. - Consider referral to dermatology if symptoms worsen or do not improve.      Relevant Medications   pimecrolimus (ELIDEL) 1 % cream      Tollie Eth, DNP, AGNP-c

## 2024-01-25 NOTE — Assessment & Plan Note (Signed)
 Chronic eczema primarily affecting the hands, exacerbated by frequent hand washing and exposure to cleaning agents. Symptoms include nocturnal itching and burning. Current treatment with mometasone at bedtime. Considering addition of non-steroidal cream (Elidel) for twice-daily use. Discussed use of cotton gloves with Aquaphor and medications at bedtime to enhance absorption. Advised on potential benefits of sunlight exposure for symptom relief. Informed consent included discussion of potential insurance issues with Elidel and plan to adjust treatment based on insurance approval. Referral to dermatology considered if symptoms worsen or do not improve. - Continue mometasone at bedtime. - Prescribe Elidel for twice-daily use, pending insurance approval. - Advise use of cotton gloves with Aquaphor and medications at bedtime. - Encourage sunlight exposure for hands. - Consider referral to dermatology if symptoms worsen or do not improve.

## 2024-01-28 ENCOUNTER — Telehealth: Payer: Self-pay | Admitting: Nurse Practitioner

## 2024-01-28 ENCOUNTER — Other Ambulatory Visit: Payer: Self-pay

## 2024-01-28 DIAGNOSIS — L301 Dyshidrosis [pompholyx]: Secondary | ICD-10-CM

## 2024-01-28 MED ORDER — PIMECROLIMUS 1 % EX CREA: TOPICAL_CREAM | Freq: Two times a day (BID) | CUTANEOUS | 3 refills | Status: AC

## 2024-01-28 NOTE — Telephone Encounter (Signed)
 Copied from CRM [id number]. Topic: Clinical - Prescription Issue >> Jan 28, 2024 12:42 PM Patsy Lager T wrote: Reason for CRM: patient stated pharmacy did not have record of the med pimecrolimus (ELIDEL) 1 % cream but system shows it was recvd 3/17 at 11:59am. Please f/u with pharmacy

## 2024-04-11 ENCOUNTER — Ambulatory Visit: Admitting: Nurse Practitioner

## 2024-04-11 ENCOUNTER — Encounter: Payer: Self-pay | Admitting: Nurse Practitioner

## 2024-04-11 VITALS — BP 128/78 | HR 72 | Wt 164.0 lb

## 2024-04-11 DIAGNOSIS — K219 Gastro-esophageal reflux disease without esophagitis: Secondary | ICD-10-CM | POA: Diagnosis not present

## 2024-04-11 DIAGNOSIS — Z3201 Encounter for pregnancy test, result positive: Secondary | ICD-10-CM

## 2024-04-11 DIAGNOSIS — N926 Irregular menstruation, unspecified: Secondary | ICD-10-CM

## 2024-04-11 HISTORY — DX: Gastro-esophageal reflux disease without esophagitis: K21.9

## 2024-04-11 LAB — POCT URINE PREGNANCY: Preg Test, Ur: POSITIVE — AB

## 2024-04-11 NOTE — Progress Notes (Signed)
 Kristina Kief, DNP, AGNP-c [facility] [address] [place], [place] [address] [phone]   ACUTE VISIT- ESTABLISHED PATIENT  Blood pressure 128/78, pulse 72, weight 164 lb (74.4 kg), last menstrual period 03/05/2024.  Subjective:  HPI History of Present Illness Kristina Galvan is a 33 year old female who presents with a positive urine pregnancy test and associated symptoms.  Her last menstrual period was on March 05, 2024, with a typical 28-day cycle and heavy flow lasting about five days. Based on her menstrual history, her estimated due date is December 10, 2024.  She experiences fatigue and stomach pains. She occasionally feels dizzy and has sharp, stabbing, very brief chest pains that come and go. She has taken Gas-X and Pepto Bismol, which seem to alleviate the chest pains.  She is currently not taking any prenatal vitamins. She plans to participate in a 5K run in July and inquires about the safety of continuing such activities during pregnancy.  Her last pregnancy was in 2022. She is in need of a referral to OB GYN.   ROS negative except for what is listed in HPI. History, Medications, Surgery, SDOH, and Family History reviewed and updated as appropriate.  Objective:  Physical Exam Vitals and nursing note reviewed.  Constitutional:      Appearance: Normal appearance.  HENT:     Head: Normocephalic.  Eyes:     Pupils: Pupils are equal, round, and reactive to light.  Neck:     Vascular: No carotid bruit.  Cardiovascular:     Rate and Rhythm: Normal rate and regular rhythm.     Pulses: Normal pulses.     Heart sounds: Normal heart sounds.  Pulmonary:     Effort: Pulmonary effort is normal.     Breath sounds: Normal breath sounds.  Abdominal:     General: Bowel sounds are normal. There is no distension.     Palpations: Abdomen is soft.     Tenderness: There is no abdominal tenderness. There is no right CVA tenderness, left CVA tenderness or  guarding.  Musculoskeletal:        General: Normal range of motion.     Cervical back: Normal range of motion.     Right lower leg: No edema.     Left lower leg: No edema.  Skin:    General: Skin is warm.     Capillary Refill: Capillary refill takes less than 2 seconds.  Neurological:     General: No focal deficit present.     Mental Status: She is alert and oriented to person, place, and time.  Psychiatric:        Mood and Affect: Mood normal.        Behavior: Behavior normal.         Assessment & Plan:   Problem List Items Addressed This Visit     Positive urine pregnancy test - Primary   Positive urine pregnancy test. Last menstrual period on April 26, indicating approximately 5 weeks and 2 days of gestation. Estimated due date is January 31, based on a regular 28-day menstrual cycle. Reports tiredness and stomach pains, but no nausea. Discussed spotting and when to seek medical attention. Provided information on pregnancy trimesters, expected changes, and the importance of prenatal care. Advised on safe over-the-counter medications and supplements during pregnancy. Normal activities and exercise, including a 5K run, are safe. - Order HCG blood test to confirm pregnancy and assess hormone levels. - Refer to OBGYN for prenatal care -  Provide list of safe over-the-counter medications and supplements during pregnancy. - Already taking prenatal vitamins, but not sure if iron is in them. OK to add iron if not in the ingredients.  - Let me know if you would like me to prescribe prenatal vitamins and I will be happy to send this in.       Relevant Orders   Beta hCG quant (ref lab)   Ambulatory referral to Obstetrics / Gynecology   Gastroesophageal reflux disease   Intermittent sharp, stabbing chest pain, likely related to reflux or gas, as symptoms are relieved with Gas-X and Pepto Bismol. Discussed the nature of heart pain and reassured that it is not persistent. Explained that Tums  and Pepcid (famotidine) are safe options for managing symptoms during pregnancy. - Recommend Tums for immediate relief of chest pain. - Advise Pepcid (famotidine) up to twice daily for more persistent symptoms.      Other Visit Diagnoses       Missed period       Relevant Orders   POCT urine pregnancy (Completed)   Beta hCG quant (ref lab)   Ambulatory referral to Obstetrics / Gynecology         Kristina Kief, DNP, AGNP-c

## 2024-04-11 NOTE — Patient Instructions (Addendum)
 Your urine pregnancy was positive. We will also check the blood work to get the hormone levels.   December 10, 2024 is the estimated due date based on your period.   Taking a 325mg  ferrous sulfate (normal iron pill) can be taken once or twice a week to add iron to the vitamin you are taking if it does not have this in it. If this doesn't work well, reach out and I will send in a prenatal vitamin to the pharmacy.   First Trimester of Pregnancy  The first trimester of pregnancy starts on the first day of your last monthly period until the end of week 13. This is months 1 through 3 of pregnancy. A week after a sperm fertilizes an egg, the egg will implant into the wall of the uterus and begin to develop into a baby. Body changes during your first trimester Your body goes through many changes during pregnancy. The changes usually return to normal after your baby is born. Physical changes Your breasts may grow larger and may hurt. The area around your nipples may get darker. Your periods will stop. Your hair and nails may grow faster. You may pee more often. Health changes You may tire easily. Your gums may bleed and may be sensitive when you brush and floss. You may not feel hungry. You may have heartburn. You may throw up or feel like you may throw up. You may want to eat some foods, but not others. You may have headaches. You may have trouble pooping (constipation). Other changes Your emotions may change from day to day. You may have more dreams. Follow these instructions at home: Medicines Talk to your health care provider if you're taking medicines. Ask if the medicines are safe to take during pregnancy. Your provider may change the medicines that you take. Do not take any medicines unless told to by your provider. Take a prenatal vitamin that has at least 600 micrograms (mcg) of folic acid. Do not use herbal medicines, illegal substances, or medicines that are not approved by your  provider. Eating and drinking While you're pregnant your body needs extra food for your growing baby. Talk with your provider about what to eat while pregnant. Activity Most women are able to exercise during pregnancy. Exercises may need to change as your pregnancy goes on. Talk to your provider about your activities and exercise routines. Relieving pain and discomfort Wear a good, supportive bra if your breasts hurt. Rest with your legs raised if you have leg cramps or low back pain. Safety Wear your seatbelt at all times when you're in a car. Talk to your provider if someone hits you, hurts you, or yells at you. Talk with your provider if you're feeling sad or have thoughts of hurting yourself. Lifestyle Certain things can be harmful while you're pregnant. Follow these rules: Do not use hot tubs, steam rooms, or saunas. Do not douche. Do not use tampons or scented pads. Do not drink alcohol,smoke, vape, or use products with nicotine or tobacco in them. If you need help quitting, talk with your provider. Avoid cat litter boxes and soil used by cats. These things carry germs that can cause harm to your pregnancy and your baby. General instructions Keep all follow-up visits. It helps you and your unborn baby stay as healthy as possible. Write down your questions. Take them to your visits. Your provider will: Talk with you about your overall health. Give you advice or refer you to specialists who can  help with different needs, including: Prenatal education classes. Mental health and counseling. Foods and healthy eating. Ask for help if you need help with food. Call your dentist and ask to be seen. Brush your teeth with a soft toothbrush. Floss gently. Where to find more information American Pregnancy Association: americanpregnancy.org [employer]ologists: acog.org Office on [employer] Health: [employer] Contact a health care provider if: You feel  dizzy, faint, or have a fever. You vomit or have watery poop (diarrhea) for 2 days or more. You have abnormal discharge or bleeding from your vagina. You have pain when you pee or your pee smells bad. You have cramps, pain, or pressure in your belly area. Get help right away if: You have trouble breathing or chest pain. You have any kind of injury, such as from a fall or a car crash. These symptoms may be an emergency. Get help right away. Call 911. Do not wait to see if the symptoms will go away. Do not drive yourself to the hospital. This information is not intended to replace advice given to you by your health care provider. Make sure you discuss any questions you have with your health care provider. Document Revised: 07/30/2023 Document Reviewed: 02/27/2023 Elsevier Patient Education  2024 [employer].  How a Baby Grows During Pregnancy Pregnancy starts when a fertilized egg attaches to the lining of the uterus and begins to grow. It is a time of many changes in the mother's body. The changes happen: To support your pregnancy. To help the baby grow. To prepare for the birth of your baby. How long does a pregnancy last? A pregnancy usually lasts 280 days, or about 40 weeks. Pregnancy is divided into three periods of growth, also called trimesters: First trimester: weeks 0-13. Second trimester: weeks 14-27. Third trimester: weeks 28-40. The end of the 40 weeks of pregnancy is your likely date of delivery, or due date. However, most babies are not born on their due date. How does my baby develop month by month?  First and second month The brain, spinal cord, and heart begin to develop. By 6 weeks, the heart begins to beat. The face, arms, and legs begin to form. Then the hands and feet begin to develop. All major organs begin to develop by the end of the second month. Third month All of the internal organs are forming. Bones and muscles are beginning to grow. The fetus is  making movements similar to breathing. Fingernails and toenails are forming. Fourth month The skin is thin and transparent. The neck, outer ear, eyelids, and fingernails are formed. The external sex organs are formed. The fetus can hear, swallow, and move easily. The kidneys begin to make pee (urine). Fifth month The fetus moves around more and can be felt for the first time (quickening). The face, nose, and lips can be seen easily on ultrasound. The baby is covered with soft hair called lanugo. The organs in the digestive system work. Sixth month The lungs continue to grow and mature. The eyes open. The brain continues to develop. The fetus may begin to suck a finger. Skin ridges are formed that will be fingerprints and toe prints. Hair grows thicker and eyebrows can be seen. Seventh month Lungs are fully developed but not yet ready for birth. Eyes are developed enough to sense changes in light. The fetus responds to sound. The fetus kicks and stretches. Hands can make a grasping motion. Vernix, a waxy coating, is starting to develop to  protect the skin. Eighth month Most organs and body systems are fully developed and working. Bones harden, and taste buds develop. The fetus may hiccup. The brain is still developing. The skull remains soft. By week 31, most development is complete and the fetus is gaining weight fast. By the end of week 32, the fetus weighs a little more than 4 pounds (1.8 kg). Ninth month until your due date The lungs are fully developed and ready for birth. Patterns of sleep develop. The fetus weighs around 6 pounds at the beginning of the ninth month and may weigh around 8 pounds by your due date. The fetus's head typically moves into a head-down position in the uterus. Closer to your due date, the fetus's head may drop lower in your hips. How do I know if my baby is developing well? Always talk with your health care provider about any concerns that you may  have about your pregnancy and your baby. At prenatal visits, your provider will do tests to check on your health and keep track of your baby's growth. These include: Fundal height and position. To do this, your provider will: Measure your growing belly from your pubic bone to the top of the uterus using a tape measure. Feel your belly to determine your baby's position. Heartbeat. An ultrasound in the first trimester can confirm pregnancy and show a heartbeat, depending on how far along you are. Your provider will check your baby's heart rate at prenatal visits. You may also have a second trimester ultrasound to check your baby's development. Follow these instructions at home: Take your medicines as told. Take prenatal vitamins as told by your provider. These include vitamins such as folic acid, iron, calcium, and vitamin D . They are important for healthy development of your baby. Keep all follow-up visits. These include prenatal care and screening tests to check your health and your baby's health. This information is not intended to replace advice given to you by your health care provider. Make sure you discuss any questions you have with your health care provider. Document Revised: 03/30/2023 Document Reviewed: 03/30/2023 Elsevier Patient Education  2024 [employer].

## 2024-04-11 NOTE — Assessment & Plan Note (Signed)
 Intermittent sharp, stabbing chest pain, likely related to reflux or gas, as symptoms are relieved with Gas-X and Pepto Bismol. Discussed the nature of heart pain and reassured that it is not persistent. Explained that Tums and Pepcid (famotidine) are safe options for managing symptoms during pregnancy. - Recommend Tums for immediate relief of chest pain. - Advise Pepcid (famotidine) up to twice daily for more persistent symptoms.

## 2024-04-11 NOTE — Assessment & Plan Note (Signed)
 Positive urine pregnancy test. Last menstrual period on April 26, indicating approximately 5 weeks and 2 days of gestation. Estimated due date is January 31, based on a regular 28-day menstrual cycle. Reports tiredness and stomach pains, but no nausea. Discussed spotting and when to seek medical attention. Provided information on pregnancy trimesters, expected changes, and the importance of prenatal care. Advised on safe over-the-counter medications and supplements during pregnancy. Normal activities and exercise, including a 5K run, are safe. - Order HCG blood test to confirm pregnancy and assess hormone levels. - Refer to OBGYN for prenatal care - Provide list of safe over-the-counter medications and supplements during pregnancy. - Already taking prenatal vitamins, but not sure if iron is in them. OK to add iron if not in the ingredients.  - Let me know if you would like me to prescribe prenatal vitamins and I will be happy to send this in.

## 2024-04-12 LAB — BETA HCG QUANT (REF LAB): hCG Quant: 5622 m[IU]/mL

## 2024-04-13 ENCOUNTER — Ambulatory Visit: Payer: Self-pay | Admitting: Nurse Practitioner

## 2024-04-13 NOTE — Addendum Note (Signed)
 Addended by: Rodrecus Belsky, Abraham Hoffmann E on: 04/13/2024 04:54 PM   Modules accepted: Orders

## 2024-05-02 ENCOUNTER — Telehealth: Payer: Self-pay | Admitting: Nurse Practitioner

## 2024-05-02 NOTE — Telephone Encounter (Signed)
 Copied from CRM [id number]. Topic: Referral - Status >> May 02, 2024  2:29 PM Donee H wrote: Reason for CRM: Patient calling to check status on referral for OB/GYN. Patient states she is [redacted] weeks pregnant and was told there will be a referral for OB/GYN but no one has called yet about an appointment nor did she get notification of referral. Requesting callback at [phone] for next steps

## 2024-05-04 ENCOUNTER — Telehealth: Payer: Self-pay

## 2024-05-04 NOTE — Telephone Encounter (Signed)
 Pt returned your call. Please call her back at your convience.

## 2024-05-04 NOTE — Telephone Encounter (Signed)
 Pt. Asking about referral to obgyn has not heard anything yet.   Copied from CRM [id number]. Topic: Referral - Status >> May 04, 2024  2:41 PM Fonda T wrote: Reason for CRM: Patient calling checking on the status of a referral to a Obstetrician.  Patient states she has not heard anything and is now [redacted] weeks pregnant, and requesting someone to follow up as soon as possible.  Patient can be reached at [phone] to advise and discuss further.

## 2024-05-05 NOTE — Telephone Encounter (Signed)
 I spoke to the pt on 05/04/25 in great length around 4pm. She was ok with the Obgyn office her referral went to. I let her know they would contact her directly and also gave her there phone number. No call backs were discussed or needed. Referral is documented with details.

## 2024-06-14 DIAGNOSIS — Z3201 Encounter for pregnancy test, result positive: Secondary | ICD-10-CM | POA: Diagnosis not present

## 2024-06-14 DIAGNOSIS — Z3689 Encounter for other specified antenatal screening: Secondary | ICD-10-CM | POA: Diagnosis not present

## 2024-06-14 DIAGNOSIS — Z3A14 14 weeks gestation of pregnancy: Secondary | ICD-10-CM | POA: Diagnosis not present

## 2024-06-14 DIAGNOSIS — Z3687 Encounter for antenatal screening for uncertain dates: Secondary | ICD-10-CM | POA: Diagnosis not present

## 2024-07-07 DIAGNOSIS — Z3A17 17 weeks gestation of pregnancy: Secondary | ICD-10-CM | POA: Diagnosis not present

## 2024-07-07 DIAGNOSIS — Z3689 Encounter for other specified antenatal screening: Secondary | ICD-10-CM | POA: Diagnosis not present

## 2024-07-27 DIAGNOSIS — O283 Abnormal ultrasonic finding on antenatal screening of mother: Secondary | ICD-10-CM | POA: Diagnosis not present

## 2024-07-27 DIAGNOSIS — Z363 Encounter for antenatal screening for malformations: Secondary | ICD-10-CM | POA: Diagnosis not present

## 2024-07-27 DIAGNOSIS — Z3A20 20 weeks gestation of pregnancy: Secondary | ICD-10-CM | POA: Diagnosis not present

## 2024-08-01 DIAGNOSIS — O9A212 Injury, poisoning and certain other consequences of external causes complicating pregnancy, second trimester: Secondary | ICD-10-CM | POA: Diagnosis not present

## 2024-08-01 DIAGNOSIS — S3991XA Unspecified injury of abdomen, initial encounter: Secondary | ICD-10-CM | POA: Diagnosis not present

## 2024-08-01 DIAGNOSIS — W19XXXA Unspecified fall, initial encounter: Secondary | ICD-10-CM | POA: Diagnosis not present

## 2024-08-01 DIAGNOSIS — Z3A21 21 weeks gestation of pregnancy: Secondary | ICD-10-CM | POA: Diagnosis not present

## 2024-08-04 ENCOUNTER — Encounter: Payer: BC Managed Care – PPO | Admitting: Nurse Practitioner

## 2024-08-24 DIAGNOSIS — Z23 Encounter for immunization: Secondary | ICD-10-CM | POA: Diagnosis not present

## 2024-11-28 ENCOUNTER — Other Ambulatory Visit: Payer: Self-pay | Admitting: Nurse Practitioner

## 2024-11-28 ENCOUNTER — Ambulatory Visit: Payer: Self-pay

## 2024-11-28 DIAGNOSIS — J101 Influenza due to other identified influenza virus with other respiratory manifestations: Secondary | ICD-10-CM

## 2024-11-28 MED ORDER — OSELTAMIVIR PHOSPHATE 75 MG PO CAPS: 75.0000 mg | ORAL_CAPSULE | Freq: Two times a day (BID) | ORAL | 0 refills | Status: AC

## 2024-11-28 NOTE — Telephone Encounter (Signed)
 Tamiflu  sent

## 2024-11-28 NOTE — Telephone Encounter (Signed)
 FYI Only or Action Required?: Action required by provider: clinical question for provider and update on patient condition.  Patient was last seen in primary care on 04/11/2024 by Early, Camie BRAVO, NP.  Called Nurse Triage reporting Influenza.  Symptoms began asymptomatic.  Interventions attempted: Nothing.  Symptoms are: stable.  Triage Disposition: Call PCP Within 24 Hours  Patient/caregiver understands and will follow disposition?: Yes     Copied from CRM [id number]. Topic: Clinical - Medical Advice >> Nov 28, 2024  1:48 PM Amber H wrote: Reason for CRM: Patient stated her daughter tested positive for tested positive for type Flu A- and her daughter's provider advised her that she need be prescribed Tamiflu  since she is currently breastfeeding. Patient is 6 days post partum. She denied any symptoms or any issues. She wanted to know if she needed to be on Tamiflu .   [phone]    Reason for Disposition  [1] Influenza EXPOSURE within last 48 hours (2 days) AND [2] lives with someone that is HIGH RISK (e.g., child less than 2 years, 65 years and older, pregnant, HIV+, chronic medical condition)  Answer Assessment - Initial Assessment Questions Returned call to f/u on request. Pt states that her daughter tested positive for Flu A today and is starting anti-viral. Pt was told by provider that she and her husband should request Tamiflu  for prophylactic tx d/t having newborn at home. Pt denies any current symptoms, pt is breastfeeding. Discussed I would update PCP and send request. [facility] in [place] is preferred pharm.     1. TYPE of EXPOSURE: How were you exposed? (e.g., close contact, not a close contact)     Pt's daughter tested positive for flu this morning   2. DATE of EXPOSURE: When did the exposure occur? (e.g., hour, days, weeks)     Known as of today   3. SYMPTOMS: Do you have any symptoms? (e.g., cough, fever, sore throat, difficulty breathing).     No current  symptoms for pt   4. HIGH RISK for COMPLICATIONS: Do you have any heart or lung problems? Do you have a weakened immune system? (e.g., CHF, COPD, asthma, HIV positive, chemotherapy, renal failure, diabetes mellitus, sickle cell anemia)     No  5. PREGNANCY: Is there any chance you are pregnant? When was your last menstrual period?     Pt is 6 days postpartum; is currently breastfeeding  Protocols used: Influenza (Flu) Exposure-A-AH

## 2025-01-12 ENCOUNTER — Encounter: Payer: Self-pay | Admitting: Nurse Practitioner
